# Patient Record
Sex: Male | Born: 1945 | Race: White | Hispanic: No | Smoking: Never smoker
Health system: Southern US, Community
[De-identification: ages and names within clinical notes are randomized; demographics above are authoritative.]

## PROBLEM LIST (undated history)

## (undated) DIAGNOSIS — K219 Gastro-esophageal reflux disease without esophagitis: Secondary | ICD-10-CM

## (undated) DIAGNOSIS — C61 Malignant neoplasm of prostate: Secondary | ICD-10-CM

## (undated) DIAGNOSIS — E785 Hyperlipidemia, unspecified: Secondary | ICD-10-CM

## (undated) DIAGNOSIS — H269 Unspecified cataract: Secondary | ICD-10-CM

## (undated) DIAGNOSIS — M1990 Unspecified osteoarthritis, unspecified site: Secondary | ICD-10-CM

## (undated) DIAGNOSIS — K635 Polyp of colon: Secondary | ICD-10-CM

## (undated) DIAGNOSIS — J342 Deviated nasal septum: Secondary | ICD-10-CM

## (undated) HISTORY — PX: EYE SURGERY: SHX253

## (undated) HISTORY — PX: TONSILLECTOMY: SUR1361

## (undated) HISTORY — DX: Malignant neoplasm of prostate: C61

## (undated) HISTORY — PX: PROSTATECTOMY: SHX69

## (undated) HISTORY — DX: Hyperlipidemia, unspecified: E78.5

## (undated) HISTORY — PX: SHOULDER ARTHROSCOPY: SHX128

## (undated) HISTORY — DX: Polyp of colon: K63.5

## (undated) HISTORY — DX: Unspecified osteoarthritis, unspecified site: M19.90

## (undated) HISTORY — PX: POLYPECTOMY: SHX149

## (undated) HISTORY — DX: Gastro-esophageal reflux disease without esophagitis: K21.9

## (undated) HISTORY — PX: KNEE ARTHROSCOPY: SHX127

## (undated) HISTORY — PX: CATARACT EXTRACTION, BILATERAL: SHX1313

## (undated) HISTORY — PX: COLONOSCOPY: SHX174

## (undated) HISTORY — DX: Deviated nasal septum: J34.2

## (undated) HISTORY — PX: NASAL SEPTUM SURGERY: SHX37

## (undated) HISTORY — DX: Unspecified cataract: H26.9

---

## 2019-11-23 DIAGNOSIS — C61 Malignant neoplasm of prostate: Secondary | ICD-10-CM

## 2019-11-23 HISTORY — DX: Malignant neoplasm of prostate: C61

## 2020-04-14 HISTORY — PX: PROSTATECTOMY: SHX69

## 2020-09-22 ENCOUNTER — Encounter: Payer: Self-pay | Admitting: Gastroenterology

## 2020-10-07 ENCOUNTER — Encounter: Payer: Self-pay | Admitting: Gastroenterology

## 2020-10-07 ENCOUNTER — Ambulatory Visit (INDEPENDENT_AMBULATORY_CARE_PROVIDER_SITE_OTHER): Payer: Medicare Other | Admitting: Gastroenterology

## 2020-10-07 VITALS — BP 124/74 | HR 68 | Ht 71.0 in | Wt 181.5 lb

## 2020-10-07 DIAGNOSIS — Z86010 Personal history of colonic polyps: Secondary | ICD-10-CM | POA: Diagnosis not present

## 2020-10-07 DIAGNOSIS — K219 Gastro-esophageal reflux disease without esophagitis: Secondary | ICD-10-CM | POA: Diagnosis not present

## 2020-10-07 MED ORDER — OMEPRAZOLE 20 MG PO CPDR
20.0000 mg | DELAYED_RELEASE_CAPSULE | Freq: Every day | ORAL | 3 refills | Status: DC
Start: 2020-10-07 — End: 2021-11-09

## 2020-10-07 NOTE — Patient Instructions (Signed)
We have sent the following medications to your pharmacy for you to pick up at your convenience:  Omeprazole  

## 2020-10-07 NOTE — Progress Notes (Signed)
     10/07/2020 Shawn Orozco [id number] [birth year]   HISTORY OF PRESENT ILLNESS:  This is a 74 year old male who is new to our office.  He is here today to establish care and would like to do so with Dr. Havery Moros.  He recently relocated here from [place].  He tells me that he had his last colonoscopy in April 2019 and has history of polyps.  He says he usually gets a colonoscopy every 3 years.  He also reports longstanding acid reflux which is well controlled on omeprazole 20 mg daily and he would like his prescription renewed for 90-day supply.  He tells me that he had an endoscopy, but it was about 15 or 16 years ago.  He says that his medication works well for him.  He denies any breakthrough heartburn or reflux.   Past Medical History:  Diagnosis Date  . Arthritis   . Cataract   . Colon polyps   . Deviated septum   . Prostate cancer [facility])    Past Surgical History:  Procedure Laterality Date  . CATARACT EXTRACTION, BILATERAL    . KNEE ARTHROSCOPY Right   . NASAL SEPTUM SURGERY    . PROSTATECTOMY    . SHOULDER ARTHROSCOPY Right   . TONSILLECTOMY      reports that he has never smoked. He has never used smokeless tobacco. He reports that he does not drink alcohol and does not use drugs. family history includes Alzheimer's disease in his brother and mother; Heart disease in his brother. No Known Allergies    Outpatient Encounter Medications as of 10/07/2020  Medication Sig  . AMBULATORY NON FORMULARY MEDICATION Compound of  Gentamicin 20 mg and Budesonide 60 mg  Used into the nasal passage as sinus rinse  . omeprazole (PRILOSEC) 20 MG capsule Take 20 mg by mouth daily.  . sildenafil (VIAGRA) 100 MG tablet Take 100 mg by mouth daily as needed for erectile dysfunction.   No facility-administered encounter medications on file as of 10/07/2020.     REVIEW OF SYSTEMS  : All other systems reviewed and negative except where noted in the History of Present Illness.   PHYSICAL  EXAM: BP 124/74 (BP Location: Left Arm, Patient Position: Sitting, Cuff Size: Normal)   Pulse 68   Ht 5\' 11"  (1.803 m) Comment: height measured without shoes  Wt 181 lb 8 oz (82.3 kg)   BMI 25.31 kg/m  General: Well developed white male in no acute distress Head: Normocephalic and atraumatic Eyes:  Sclerae anicteric, conjunctiva pink. Ears: Normal auditory acuity Lungs: Clear throughout to auscultation; no W/R/R. Heart: Regular rate and rhythm; no M/R/G. Abdomen: Soft, non-distended.  BS present.  Non-tender. Musculoskeletal: Symmetrical with no gross deformities  Skin: No lesions on visible extremities Extremities: No edema  Neurological: Alert oriented x 4, grossly non-focal Psychological:  Alert and cooperative. Normal mood and affect  ASSESSMENT AND PLAN: *GERD: Well-controlled on omeprazole 20 mg daily.  Needs prescription renewed.  We will send this to pharmacy. *History of colon polyps: Last colonoscopy April 2019.  He says that he usually gets a colonoscopy every 3 years.  Will request records from Dr. Alena Bills from [facility] in [place], [place].  **He would like to establish care here with Dr. Havery Moros.   CC:  No ref. provider found

## 2020-10-13 DIAGNOSIS — Z86010 Personal history of colonic polyps: Secondary | ICD-10-CM | POA: Insufficient documentation

## 2020-10-13 DIAGNOSIS — K219 Gastro-esophageal reflux disease without esophagitis: Secondary | ICD-10-CM | POA: Insufficient documentation

## 2020-10-13 NOTE — Progress Notes (Signed)
Agree with assessment and plan as outlined.  

## 2020-12-05 DIAGNOSIS — J339 Nasal polyp, unspecified: Secondary | ICD-10-CM | POA: Insufficient documentation

## 2020-12-15 ENCOUNTER — Other Ambulatory Visit: Payer: Self-pay

## 2020-12-15 ENCOUNTER — Ambulatory Visit (INDEPENDENT_AMBULATORY_CARE_PROVIDER_SITE_OTHER): Payer: Medicare Other | Admitting: Internal Medicine

## 2020-12-15 ENCOUNTER — Other Ambulatory Visit: Payer: Self-pay | Admitting: Internal Medicine

## 2020-12-15 ENCOUNTER — Encounter: Payer: Self-pay | Admitting: Internal Medicine

## 2020-12-15 VITALS — BP 138/82 | HR 78 | Temp 98.3°F | Resp 16 | Ht 71.5 in | Wt 183.0 lb

## 2020-12-15 DIAGNOSIS — E785 Hyperlipidemia, unspecified: Secondary | ICD-10-CM | POA: Diagnosis not present

## 2020-12-15 DIAGNOSIS — C61 Malignant neoplasm of prostate: Secondary | ICD-10-CM | POA: Diagnosis not present

## 2020-12-15 DIAGNOSIS — Z23 Encounter for immunization: Secondary | ICD-10-CM

## 2020-12-15 DIAGNOSIS — R3981 Functional urinary incontinence: Secondary | ICD-10-CM

## 2020-12-15 DIAGNOSIS — R32 Unspecified urinary incontinence: Secondary | ICD-10-CM | POA: Insufficient documentation

## 2020-12-15 DIAGNOSIS — Z86010 Personal history of colonic polyps: Secondary | ICD-10-CM

## 2020-12-15 DIAGNOSIS — N529 Male erectile dysfunction, unspecified: Secondary | ICD-10-CM | POA: Insufficient documentation

## 2020-12-15 MED ORDER — VITAMIN D3 50 MCG (2000 UT) PO CAPS: 2000.0000 [IU] | ORAL_CAPSULE | Freq: Every day | ORAL | 3 refills | Status: DC

## 2020-12-15 NOTE — Assessment & Plan Note (Addendum)
Prostate cancer Feb 2021 - s/p robotic prostatectomy in [place] F/u Dr Milford Cage in [facility] Start vitamin D daily

## 2020-12-15 NOTE — Assessment & Plan Note (Signed)
Post prostatectomy.  2 pads a day.  Follow-up with urology

## 2020-12-15 NOTE — Patient Instructions (Signed)

## 2020-12-15 NOTE — Assessment & Plan Note (Signed)
Benign.

## 2020-12-15 NOTE — Assessment & Plan Note (Signed)
Just had a stress test ?mild A cardiac CT scan for calcium scoring 11/2020

## 2020-12-15 NOTE — Assessment & Plan Note (Signed)
Last colon 2019, due in 4/22

## 2020-12-15 NOTE — Addendum Note (Signed)
Addended by: Jiles Prows on: 12/15/2020 11:47 AM   Modules accepted: Orders

## 2020-12-15 NOTE — Progress Notes (Signed)
Subjective:  Patient ID: Shawn Orozco, male    DOB: 1946-07-14  Age: 75 y.o. MRN: [id number]  CC: New Patient (Initial Visit)   HPI Shawn Orozco presents for a new pt  Just moved here from [facility]  H/o colon polyps, prostate cancer Feb 2021 - s/p robotic prostatectomy C/o leakage - 2 pads a day  We will have to obtain records from [place]    Outpatient Medications Prior to Visit  Medication Sig Dispense Refill  . AMBULATORY NON FORMULARY MEDICATION Compound of  Gentamicin 20 mg and Budesonide 60 mg  Used into the nasal passage as sinus rinse    . omeprazole (PRILOSEC) 20 MG capsule Take 1 capsule (20 mg total) by mouth daily. 90 capsule 3  . sildenafil (VIAGRA) 100 MG tablet Take 100 mg by mouth daily as needed for erectile dysfunction.     No facility-administered medications prior to visit.    ROS: Review of Systems  Constitutional: Negative for appetite change, fatigue and unexpected weight change.  HENT: Negative for congestion, nosebleeds, sneezing, sore throat and trouble swallowing.   Eyes: Negative for itching and visual disturbance.  Respiratory: Negative for cough.   Cardiovascular: Negative for chest pain, palpitations and leg swelling.  Gastrointestinal: Negative for abdominal distention, blood in stool, diarrhea and nausea.  Genitourinary: Negative for frequency and hematuria.  Musculoskeletal: Negative for back pain, gait problem, joint swelling and neck pain.  Skin: Negative for rash.  Neurological: Negative for dizziness, tremors, speech difficulty and weakness.  Psychiatric/Behavioral: Negative for agitation, dysphoric mood, sleep disturbance and suicidal ideas. The patient is not nervous/anxious.     Objective:  BP 138/82 (BP Location: Left Arm, Patient Position: Sitting, Cuff Size: Small)   Pulse 78   Temp 98.3 F (36.8 C) (Oral)   Resp 16   Ht 5' 11.5" (1.816 m)   Wt 183 lb (83 kg)   SpO2 97%   BMI 25.17 kg/m   BP Readings from Last 3  Encounters:  12/15/20 138/82  10/07/20 124/74    Wt Readings from Last 3 Encounters:  12/15/20 183 lb (83 kg)  10/07/20 181 lb 8 oz (82.3 kg)    Physical Exam Constitutional:      General: He is not in acute distress.    Appearance: He is well-developed.     Comments: NAD  HENT:     Mouth/Throat:     Mouth: Oropharynx is clear and moist.  Eyes:     Conjunctiva/sclera: Conjunctivae normal.     Pupils: Pupils are equal, round, and reactive to light.  Neck:     Thyroid: No thyromegaly.     Vascular: No JVD.  Cardiovascular:     Rate and Rhythm: Normal rate and regular rhythm.     Pulses: Intact distal pulses.     Heart sounds: Normal heart sounds. No murmur heard. No friction rub. No gallop.   Pulmonary:     Effort: Pulmonary effort is normal. No respiratory distress.     Breath sounds: Normal breath sounds. No wheezing or rales.  Chest:     Chest wall: No tenderness.  Abdominal:     General: Bowel sounds are normal. There is no distension.     Palpations: Abdomen is soft. There is no mass.     Tenderness: There is no abdominal tenderness. There is no guarding or rebound.  Musculoskeletal:        General: No tenderness or edema. Normal range of motion.     Cervical back:  Normal range of motion.  Lymphadenopathy:     Cervical: No cervical adenopathy.  Skin:    General: Skin is warm and dry.     Findings: No rash.  Neurological:     Mental Status: He is alert and oriented to person, place, and time.     Cranial Nerves: No cranial nerve deficit.     Motor: No abnormal muscle tone.     Coordination: He displays a negative Romberg sign. Coordination normal.     Gait: Gait normal.     Deep Tendon Reflexes: Reflexes are normal and symmetric.  Psychiatric:        Mood and Affect: Mood and affect normal.        Behavior: Behavior normal.        Thought Content: Thought content normal.        Judgment: Judgment normal.     No results found for: WBC, HGB, HCT, PLT,  GLUCOSE, CHOL, TRIG, HDL, LDLDIRECT, LDLCALC, ALT, AST, NA, K, CL, CREATININE, BUN, CO2, TSH, PSA, INR, GLUF, HGBA1C, MICROALBUR  Patient was never admitted.  Assessment & Plan:   There are no diagnoses linked to this encounter.   No orders of the defined types were placed in this encounter.    Follow-up: No follow-ups on file.  Walker Kehr, MD

## 2020-12-15 NOTE — Assessment & Plan Note (Signed)
On Viagra 

## 2020-12-24 ENCOUNTER — Other Ambulatory Visit: Payer: Self-pay

## 2020-12-24 ENCOUNTER — Ambulatory Visit (INDEPENDENT_AMBULATORY_CARE_PROVIDER_SITE_OTHER)
Admission: RE | Admit: 2020-12-24 | Discharge: 2020-12-24 | Disposition: A | Discharge status: Home / Self Care | Payer: Self-pay | Source: Ambulatory Visit | Attending: Internal Medicine | Admitting: Internal Medicine

## 2020-12-24 DIAGNOSIS — E785 Hyperlipidemia, unspecified: Secondary | ICD-10-CM

## 2020-12-26 ENCOUNTER — Other Ambulatory Visit: Payer: Self-pay | Admitting: Internal Medicine

## 2020-12-26 DIAGNOSIS — I2510 Atherosclerotic heart disease of native coronary artery without angina pectoris: Secondary | ICD-10-CM | POA: Insufficient documentation

## 2020-12-26 DIAGNOSIS — I2583 Coronary atherosclerosis due to lipid rich plaque: Secondary | ICD-10-CM

## 2020-12-26 MED ORDER — ASPIRIN EC 81 MG PO TBEC: 81.0000 mg | DELAYED_RELEASE_TABLET | Freq: Every day | ORAL | 3 refills | Status: AC

## 2020-12-26 MED ORDER — ROSUVASTATIN CALCIUM 5 MG PO TABS: 5.0000 mg | ORAL_TABLET | Freq: Every day | ORAL | 11 refills | Status: DC

## 2021-01-05 ENCOUNTER — Other Ambulatory Visit: Payer: Self-pay

## 2021-01-05 ENCOUNTER — Ambulatory Visit (INDEPENDENT_AMBULATORY_CARE_PROVIDER_SITE_OTHER): Payer: Medicare Other | Admitting: Internal Medicine

## 2021-01-05 ENCOUNTER — Encounter: Payer: Self-pay | Admitting: Internal Medicine

## 2021-01-05 VITALS — BP 120/60 | HR 64 | Ht 71.5 in | Wt 182.0 lb

## 2021-01-05 DIAGNOSIS — I2583 Coronary atherosclerosis due to lipid rich plaque: Secondary | ICD-10-CM | POA: Diagnosis not present

## 2021-01-05 DIAGNOSIS — I2510 Atherosclerotic heart disease of native coronary artery without angina pectoris: Secondary | ICD-10-CM

## 2021-01-05 DIAGNOSIS — I4510 Unspecified right bundle-branch block: Secondary | ICD-10-CM | POA: Diagnosis not present

## 2021-01-05 DIAGNOSIS — E785 Hyperlipidemia, unspecified: Secondary | ICD-10-CM | POA: Diagnosis not present

## 2021-01-05 DIAGNOSIS — C61 Malignant neoplasm of prostate: Secondary | ICD-10-CM | POA: Diagnosis not present

## 2021-01-05 NOTE — Patient Instructions (Signed)
Medication Instructions:  Your physician recommends that you continue on your current medications as directed. Please refer to the Current Medication list given to you today.  *If you need a refill on your cardiac medications before your next appointment, please call your pharmacy*   Lab Work: IN 4 MONTHS: lipid panel and LFT If you have labs (blood work) drawn today and your tests are completely normal, you will receive your results only by: Marland Kitchen MyChart Message (if you have MyChart) OR . A paper copy in the mail If you have any lab test that is abnormal or we need to change your treatment, we will call you to review the results.   Testing/Procedures: NONE   Follow-Up: At [facility], you and your health needs are our priority.  As part of our continuing mission to provide you with exceptional heart care, we have created designated Provider Care Teams.  These Care Teams include your primary Cardiologist (physician) and Advanced Practice Providers (APPs -  Physician Assistants and Nurse Practitioners) who all work together to provide you with the care you need, when you need it.  We recommend signing up for the patient portal called "MyChart".  Sign up information is provided on this After Visit Summary.  MyChart is used to connect with patients for Virtual Visits (Telemedicine).  Patients are able to view lab/test results, encounter notes, upcoming appointments, etc.  Non-urgent messages can be sent to your provider as well.   To learn more about what you can do with MyChart, go to [email or web address].    Your next appointment:   4 month(s)  The format for your next appointment:   In Person  Provider:   You may see Gasper Sells, MD or one of the following Advanced Practice Providers on your designated Care Team:    Melina Copa, PA-C  Ermalinda Barrios, PA-C

## 2021-01-05 NOTE — Progress Notes (Signed)
Cardiology Office Note:    Date:  01/05/2021   ID:  Shawn Orozco, DOB 04/19/1946, MRN [id number]  PCP:  Cassandria Anger, MD   [place]  Cardiologist:  No primary care provider on file.  Advanced Practice Provider:  No care team member to display Electrophysiologist:  None      CC: Discuss CT results Consulted for the evaluation of CAC at the behest of Plotnikov, Evie Lacks, MD  History of Present Illness:    Shawn Orozco is a 75 y.o. male with a hx of HLD, and coronary artery calcification who presents for evaluation 01/05/21.  Oncological History notable for: Malignancies: prostate cancer Surgery: 03/2020 Chemotherapy: None Radiation: none Oncology care spearheaded by: Presently in [place]  Patient notes that he is feeling fine.  Notes that May 24th ofn 2021- had robotic cancer surgery.  Prior to that would walk 4-6 miles a day. Since surgery had recovery (still has bladder control issues), moved from [place], and is hoping to get back to normal.  Patient has recommend by PCP- has CAC.     Has had no chest pain, chest pressure, chest tightness, chest stinging .   Patient exertion notable for remodeling his current house and feels no symptoms.  No shortness of breath, DOE.  No PND or orthopnea.  No bendopnea, weight gain, leg swelling , or abdominal swelling.  No syncope or near syncope . Notes  no palpitations or funny heart beats.     Patient reports prior cardiac testing including  2021 stress test in [place] prior to prostate surgery. Just started Rosuvastatin.  Past Medical History:  Diagnosis Date  . Arthritis   . Cataract   . Colon polyps   . Deviated septum   . Prostate cancer [facility])     Past Surgical History:  Procedure Laterality Date  . CATARACT EXTRACTION, BILATERAL    . KNEE ARTHROSCOPY Right   . NASAL SEPTUM SURGERY    . PROSTATECTOMY    . SHOULDER ARTHROSCOPY Right   . TONSILLECTOMY      Current Medications: Current Meds   Medication Sig  . aspirin EC 81 MG tablet Take 1 tablet (81 mg total) by mouth daily.  . Cholecalciferol (VITAMIN D3) 50 MCG (2000 UT) capsule Take 1 capsule (2,000 Units total) by mouth daily.  . fluorouracil (EFUDEX) 5 % cream APPLY TOPICALLY TWICE DAILY TO THE FOREARMS FOR SIX WEEKS  . Hypertonic Nasal Wash (SINUS RINSE) PACK Place into the nose daily.  Marland Kitchen ketoconazole (NIZORAL) 2 % cream APPLY A SMALL AMOUNT TOPICALLY BETWEEN THE TOES AND TO AFFECTED AREA(S) TWO TIMES A DAY AS NEEDED  . Niacinamide 500 MG TBCR Take 500 mg by mouth daily.  Marland Kitchen omeprazole (PRILOSEC) 20 MG capsule Take 1 capsule (20 mg total) by mouth daily.  . rosuvastatin (CRESTOR) 5 MG tablet Take 1 tablet (5 mg total) by mouth daily.  . sildenafil (VIAGRA) 100 MG tablet Take 100 mg by mouth daily as needed for erectile dysfunction.     Allergies:   Patient has no known allergies.   Social History   Socioeconomic History  . Marital status: Divorced    Spouse name: Not on file  . Number of children: 0  . Years of education: Not on file  . Highest education level: Not on file  Occupational History  . Occupation: retired  Tobacco Use  . Smoking status: Never Smoker  . Smokeless tobacco: Never Used  Vaping Use  . Vaping Use:  Never used  Substance and Sexual Activity  . Alcohol use: Never  . Drug use: Never  . Sexual activity: Not Currently  Other Topics Concern  . Not on file  Social History Narrative  . Not on file   Social Determinants of Health   Financial Resource Strain: Not on file  Food Insecurity: Not on file  Transportation Needs: Not on file  Physical Activity: Inactive  . Days of Exercise per Week: 0 days  . Minutes of Exercise per Session: 0 min  Stress: Not on file  Social Connections: Not on file    SOCIAL:  Moved to be with his brother who passed October 08/2020; Remodeling that house   Family History: The patient's family history includes Alzheimer's disease in his brother and  mother; Heart disease in his brother. History of coronary artery disease notable for two (half) brothers. History of heart failure notable for no members. History of arrhythmia notable for no members. No history of bicuspid aortic valve or aortic aneurysm or dissection.  ROS:   Please see the history of present illness.     All other systems reviewed and are negative.  EKGs/Labs/Other Studies Reviewed:    The following studies were reviewed today:  EKG:   01/05/21: SR rate 64 RBBB  CAC Date: 12/24/20 Results: Coronary calcium score of 118. This was 36th percentile for age and sex matched control   Recent Labs: No results found for requested labs within last 8760 hours.  Recent Lipid Panel No results found for: CHOL, TRIG, HDL, CHOLHDL, VLDL, LDLCALC, LDLDIRECT   Risk Assessment/Calculations:     N/A  Physical Exam:    VS:  BP 120/60   Pulse 64   Ht 5' 11.5" (1.816 m)   Wt 182 lb (82.6 kg)   SpO2 98%   BMI 25.03 kg/m     Wt Readings from Last 3 Encounters:  01/05/21 182 lb (82.6 kg)  12/15/20 183 lb (83 kg)  10/07/20 181 lb 8 oz (82.3 kg)     GEN:  Well nourished, well developed in no acute distress HEENT: Normal NECK: No JVD; No carotid bruits LYMPHATICS: No lymphadenopathy CARDIAC: RRR, no murmurs, rubs, gallops RESPIRATORY:  Clear to auscultation without rales, wheezing or rhonchi  ABDOMEN: Soft, non-tender, non-distended MUSCULOSKELETAL:  No edema; No deformity  SKIN: Warm and dry NEUROLOGIC:  Alert and oriented x 3 PSYCHIATRIC:  Normal affect   ASSESSMENT:    1. 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 Prostate cancer ([place])   3. Dyslipidemia   4. RBBB    PLAN:    In order of problems listed above:  Coronary Artery Calcification -LDL goal less than 70 -continue current statin (recent start) - Reviewed images with patient -Recheck lipid profile LFTs (fasting in 4 months) - gave education on dietary changes  Prostate  Cancer - No chemotherapy or radiation that would increase cardiac risk  Four months follow up unless new symptoms or abnormal test results warranting change in plan  Would be reasonable for  APP Follow up   Medication Adjustments/Labs and Tests Ordered: Current medicines are reviewed at length with the patient today.  Concerns regarding medicines are outlined above.  Orders Placed This Encounter  Procedures  . Lipid panel  . Hepatic function panel  . EKG 12-Lead   No orders of the defined types were placed in this encounter.   Patient Instructions  Medication Instructions:  Your physician recommends that you continue on your current  medications as directed. Please refer to the Current Medication list given to you today.  *If you need a refill on your cardiac medications before your next appointment, please call your pharmacy*   Lab Work: IN 4 MONTHS: lipid panel and LFT If you have labs (blood work) drawn today and your tests are completely normal, you will receive your results only by: Marland Kitchen MyChart Message (if you have MyChart) OR . A paper copy in the mail If you have any lab test that is abnormal or we need to change your treatment, we will call you to review the results.   Testing/Procedures: NONE   Follow-Up: At [facility], you and your health needs are our priority.  As part of our continuing mission to provide you with exceptional heart care, we have created designated Provider Care Teams.  These Care Teams include your primary Cardiologist (physician) and Advanced Practice Providers (APPs -  Physician Assistants and Nurse Practitioners) who all work together to provide you with the care you need, when you need it.  We recommend signing up for the patient portal called "MyChart".  Sign up information is provided on this After Visit Summary.  MyChart is used to connect with patients for Virtual Visits (Telemedicine).  Patients are able to view lab/test results,  encounter notes, upcoming appointments, etc.  Non-urgent messages can be sent to your provider as well.   To learn more about what you can do with MyChart, go to [email or web address].    Your next appointment:   4 month(s)  The format for your next appointment:   In Person  Provider:   You may see Gasper Sells, MD or one of the following Advanced Practice Providers on your designated Care Team:    Melina Copa, PA-C  Ermalinda Barrios, PA-C         Signed, Werner Lean, MD  01/05/2021 8:43 AM    [place]

## 2021-01-14 ENCOUNTER — Other Ambulatory Visit: Payer: Self-pay

## 2021-01-14 ENCOUNTER — Encounter: Payer: Self-pay | Admitting: Gastroenterology

## 2021-01-14 ENCOUNTER — Ambulatory Visit (INDEPENDENT_AMBULATORY_CARE_PROVIDER_SITE_OTHER): Payer: Medicare Other | Admitting: Gastroenterology

## 2021-01-14 VITALS — BP 134/70 | HR 64 | Ht 71.0 in | Wt 192.0 lb

## 2021-01-14 DIAGNOSIS — K219 Gastro-esophageal reflux disease without esophagitis: Secondary | ICD-10-CM | POA: Diagnosis not present

## 2021-01-14 DIAGNOSIS — Z86010 Personal history of colonic polyps: Secondary | ICD-10-CM

## 2021-01-14 NOTE — Progress Notes (Signed)
01/14/2021 Shawn Orozco [id number] [birth year]   HISTORY OF PRESENT ILLNESS: This is a pleasant 75 year old male who saw me to establish care in November 2021.  He had moved here from [place].  He has history of colon polyps and GERD.  He was wanting a renewal of his prescription omeprazole 20 mg daily.  He comes here today again for follow-up.  He tells me that his last colonoscopy was in April 2019 and that he was told that he needed another one in 3 years.  Also, he reports that since he moved here several months ago he has had 2 or 3 episodes of sudden onset diarrhea followed by episodes of vomiting.  He had 1 of these episodes about 3 weeks ago.  It lasted for 3 days and then resolved with no residual symptoms.  He is currently feeling well.  His bowel habits are back to normal.  He says that occasionally he will feel reflux and discomfort in his epigastrium.  He is only using his omeprazole on an as-needed basis.  His weight is stable, actually up about 11 pounds from his visit here in November.    Past Medical History:  Diagnosis Date  . Arthritis   . Cataract   . Colon polyps   . Deviated septum   . Prostate cancer [facility])    Past Surgical History:  Procedure Laterality Date  . CATARACT EXTRACTION, BILATERAL    . KNEE ARTHROSCOPY Right   . NASAL SEPTUM SURGERY    . PROSTATECTOMY    . SHOULDER ARTHROSCOPY Right   . TONSILLECTOMY      reports that he has never smoked. He has never used smokeless tobacco. He reports that he does not drink alcohol and does not use drugs. family history includes Alzheimer's disease in his brother and mother; Heart disease in his brother. No Known Allergies    Outpatient Encounter Medications as of 01/14/2021  Medication Sig  . aspirin EC 81 MG tablet Take 1 tablet (81 mg total) by mouth daily.  . Cholecalciferol (VITAMIN D3) 50 MCG (2000 UT) capsule Take 1 capsule (2,000 Units total) by mouth daily.  . fluorouracil (EFUDEX) 5 % cream APPLY  TOPICALLY TWICE DAILY TO THE FOREARMS FOR SIX WEEKS  . Hypertonic Nasal Wash (SINUS RINSE) PACK Place into the nose daily.  Marland Kitchen ketoconazole (NIZORAL) 2 % cream APPLY A SMALL AMOUNT TOPICALLY BETWEEN THE TOES AND TO AFFECTED AREA(S) TWO TIMES A DAY AS NEEDED  . Niacinamide 500 MG TBCR Take 500 mg by mouth daily.  Marland Kitchen omeprazole (PRILOSEC) 20 MG capsule Take 1 capsule (20 mg total) by mouth daily.  . rosuvastatin (CRESTOR) 5 MG tablet Take 1 tablet (5 mg total) by mouth daily.  . sildenafil (VIAGRA) 100 MG tablet Take 100 mg by mouth daily as needed for erectile dysfunction.   No facility-administered encounter medications on file as of 01/14/2021.     REVIEW OF SYSTEMS  : All other systems reviewed and negative except where noted in the History of Present Illness.   PHYSICAL EXAM: BP 134/70 (BP Location: Left Arm, Patient Position: Sitting, Cuff Size: Normal)   Pulse 64   Ht 5\' 11"  (1.803 m)   Wt 192 lb (87.1 kg)   BMI 26.78 kg/m  General: Well developed white male in no acute distress Head: Normocephalic and atraumatic Eyes:  Sclerae anicteric, conjunctiva pink. Ears: Normal auditory acuity Lungs: Clear throughout to auscultation; no W/R/R. Heart: Regular rate and rhythm; no M/R/G. Abdomen:  Soft, non-distended.  BS present.  Non-tender. Musculoskeletal: Symmetrical with no gross deformities  Skin: No lesions on visible extremities Extremities: No edema  Neurological: Alert oriented x 4, grossly non-focal Psychological:  Alert and cooperative. Normal mood and affect  ASSESSMENT AND PLAN: *GERD: Has only been using his omeprazole 20 mg as needed.  Sounds like he may be having some flare of symptoms more frequently recently.  I have asked him to begin taking his medication daily.  May want to consider EGD since his last was several years ago, but I am waiting to see when he needs a recall colonoscopy so that way we could have both performed if colonoscopy is needed at this time.  Will  contact him soon with suggestion.   **We actually then received his colonoscopy records today before I completed my note.  He had colonoscopy April 2019 at which time he had one adenomatous polyp removed from the cecum as well as diverticuli and hemorrhoids.  Colonoscopy from February 19, 2015 showed multiple polyps that were removed up to 8 mm in size, some of those being adenomatous polyps and some being hyperplastic polyps.  There were 7 removed in total.  He also had diverticulosis and hemorrhoids.  In March 2013 he had an EGD that showed chronic reflux esophagitis and gastritis.  He also had a colonoscopy that showed 5 polyps that were removed, a couple that were adenomatous polyps including 1 of those being a serrated adenoma of the right colon and others hyperplastic polyps.  Gastric biopsy showed no evidence of H. pylori.  Distal esophageal biopsy showed mild acute and chronic esophagitis with no Barrett's esophagus or eosinophilic esophagitis.  I am going to have Dr. Havery Moros review these records to give recommendations on recall and they will set be sent to be scanned.  CC:  Plotnikov, Evie Lacks, MD

## 2021-01-14 NOTE — Patient Instructions (Addendum)
Continue Omeprazole 20mg  once daily- everyday.   We will obtain your records from Dr. Biagio Borg office. If you have not heard from our office in 1 week, please call and ask for Azan Maneri - [phone].   If you are age 76 or older, your body mass index should be between 23-30. Your Body mass index is 26.78 kg/m. If this is out of the aforementioned range listed, please consider follow up with your Primary Care Provider.  If you are age 78 or younger, your body mass index should be between 19-25. Your Body mass index is 26.78 kg/m. If this is out of the aformentioned range listed, please consider follow up with your Primary Care Provider.    Thank you for choosing me and [facility].  Jessica Zehr-PA C

## 2021-01-15 NOTE — Progress Notes (Signed)
Agree with assessment with the following thoughts: Looks like his last colonoscopy in April 2019 he had one adenoma removed. Keane Scrape can you clarify what the size was of this? If small, < 1cm, he would not be due for another colonoscopy until 02/2023 given his history of 7 polyps removed on his exam prior to that. If however the polyp was large, 1cm or greater, he would be do for another colonoscopy in April of this year.   Otherwise, he had esophagitis on EGD in 2013. Given his reflux symptoms and upper tract symptoms, I think a follow up EGD after he has been on omeprazole 20mg  / day for one month is reasonable to rule out Barrett's if he is interested in pursuing that. Thanks

## 2021-01-15 NOTE — Progress Notes (Signed)
I reviewed prior colonoscopy note from 2019.  The reports is a hand written note which I can't interpret other than he had a cecal polyp removed, no mention of size of the polyp or prep quality. While he may not truly need an exam now if he only had one small polyp, his other GI provider recommended a 3 year follow up based on this result, so I would add the colonoscopy to be done at the same time as his EGD, now, as I can't otherwise read much of any of the report of his last exam due to poor hand writing. There is no typed report.

## 2021-01-21 NOTE — Progress Notes (Signed)
Spoke with patient and scheduled his endo/colon and his previsit appointment for April.

## 2021-02-25 ENCOUNTER — Other Ambulatory Visit: Payer: Self-pay

## 2021-02-26 ENCOUNTER — Encounter: Payer: Self-pay | Admitting: Internal Medicine

## 2021-02-26 ENCOUNTER — Other Ambulatory Visit: Payer: Self-pay

## 2021-02-26 ENCOUNTER — Ambulatory Visit (INDEPENDENT_AMBULATORY_CARE_PROVIDER_SITE_OTHER): Payer: Medicare Other | Admitting: Internal Medicine

## 2021-02-26 ENCOUNTER — Ambulatory Visit (AMBULATORY_SURGERY_CENTER): Payer: Self-pay | Admitting: *Deleted

## 2021-02-26 VITALS — Ht 71.0 in | Wt 188.0 lb

## 2021-02-26 DIAGNOSIS — E785 Hyperlipidemia, unspecified: Secondary | ICD-10-CM | POA: Diagnosis not present

## 2021-02-26 DIAGNOSIS — Z86010 Personal history of colonic polyps: Secondary | ICD-10-CM

## 2021-02-26 DIAGNOSIS — R3981 Functional urinary incontinence: Secondary | ICD-10-CM | POA: Diagnosis not present

## 2021-02-26 DIAGNOSIS — I2583 Coronary atherosclerosis due to lipid rich plaque: Secondary | ICD-10-CM | POA: Diagnosis not present

## 2021-02-26 DIAGNOSIS — C61 Malignant neoplasm of prostate: Secondary | ICD-10-CM | POA: Diagnosis not present

## 2021-02-26 DIAGNOSIS — I2510 Atherosclerotic heart disease of native coronary artery without angina pectoris: Secondary | ICD-10-CM | POA: Diagnosis not present

## 2021-02-26 DIAGNOSIS — K219 Gastro-esophageal reflux disease without esophagitis: Secondary | ICD-10-CM

## 2021-02-26 LAB — COMPREHENSIVE METABOLIC PANEL
ALT: 13 U/L (ref 0–53)
AST: 18 U/L (ref 0–37)
Albumin: 4.3 g/dL (ref 3.5–5.2)
Alkaline Phosphatase: 80 U/L (ref 39–117)
BUN: 16 mg/dL (ref 6–23)
CO2: 30 mEq/L (ref 19–32)
Calcium: 9.7 mg/dL (ref 8.4–10.5)
Chloride: 104 mEq/L (ref 96–112)
Creatinine, Ser: 1.19 mg/dL (ref 0.40–1.50)
GFR: 59.84 mL/min — ABNORMAL LOW (ref >60.00)
Glucose, Bld: 104 mg/dL — ABNORMAL HIGH (ref 70–99)
Potassium: 4 mEq/L (ref 3.5–5.1)
Sodium: 140 mEq/L (ref 135–145)
Total Bilirubin: 1.9 mg/dL — ABNORMAL HIGH (ref 0.2–1.2)
Total Protein: 7.1 g/dL (ref 6.0–8.3)

## 2021-02-26 LAB — LIPID PANEL
Cholesterol: 154 mg/dL (ref 0–200)
HDL: 46.4 mg/dL (ref >39.00)
LDL Cholesterol: 85 mg/dL (ref 0–99)
NonHDL: 108.03
Total CHOL/HDL Ratio: 3
Triglycerides: 114 mg/dL (ref 0.0–149.0)
VLDL: 22.8 mg/dL (ref 0.0–40.0)

## 2021-02-26 MED ORDER — SUTAB 1479-225-188 MG PO TABS: 24.0000 | ORAL_TABLET | ORAL | 0 refills | Status: DC

## 2021-02-26 NOTE — Progress Notes (Signed)
No egg or soy allergy known to patient  No issues with past sedation with any surgeries or procedures Patient denies ever being told they had issues or difficulty with intubation  No FH of Malignant Hyperthermia No diet pills per patient No home 02 use per patient  No blood thinners per patient  Pt denies issues with constipation  No A fib or A flutter  EMMI video to pt or via [place] 19 guidelines implemented in [place] today with Pt and RN  Pt is fully vaccinated  for [employer] given to pt in [place] today , Code to Pharmacy and  NO PA's for preps discussed with pt In PV today  Discussed with pt there will be an out-of-pocket cost for prep and that varies from $0 to 70 dollars   Due to the COVID-19 pandemic we are asking patients to follow certain guidelines.  Pt aware of COVID protocols and [facility] guidelines   Pt denies bonded. Loose or missing teeth- he has a front crown - no dentures

## 2021-02-26 NOTE — Assessment & Plan Note (Addendum)
F/u w/Dr Gasper Sells On aspirin and Crestor Lipids

## 2021-02-26 NOTE — Assessment & Plan Note (Signed)
Lipids ordered On Crestor Goal LDL <70 Dr Gasper Sells

## 2021-02-26 NOTE — Addendum Note (Signed)
Addended by: Earnstine Regal on: 02/26/2021 10:13 AM   Modules accepted: Orders

## 2021-02-26 NOTE — Assessment & Plan Note (Signed)
F/u Dr Milford Cage  PSA on Mon

## 2021-02-26 NOTE — Assessment & Plan Note (Signed)
Better  

## 2021-02-26 NOTE — Addendum Note (Signed)
Addended by: Boris Lown B on: 02/26/2021 10:14 AM   Modules accepted: Orders

## 2021-02-26 NOTE — Progress Notes (Signed)
Subjective:  Patient ID: Shawn Orozco, male    DOB: 1946/07/31  Age: 75 y.o. MRN: [id number]  CC: Follow-up (3 month f/u)   HPI Shawn Orozco presents for CAD, dyslipidemia, prostate cancer  Outpatient Medications Prior to Visit  Medication Sig Dispense Refill  . aspirin EC 81 MG tablet Take 1 tablet (81 mg total) by mouth daily. 100 tablet 3  . Cholecalciferol (VITAMIN D3) 50 MCG (2000 UT) capsule Take 1 capsule (2,000 Units total) by mouth daily. 100 capsule 3  . Hypertonic Nasal Wash (SINUS RINSE) PACK Place into the nose daily.    Marland Kitchen ketoconazole (NIZORAL) 2 % cream APPLY A SMALL AMOUNT TOPICALLY BETWEEN THE TOES AND TO AFFECTED AREA(S) TWO TIMES A DAY AS NEEDED    . Niacinamide 500 MG TBCR Take 500 mg by mouth daily.    Marland Kitchen omeprazole (PRILOSEC) 20 MG capsule Take 1 capsule (20 mg total) by mouth daily. 90 capsule 3  . rosuvastatin (CRESTOR) 5 MG tablet Take 1 tablet (5 mg total) by mouth daily. 30 tablet 11  . sildenafil (VIAGRA) 100 MG tablet Take 100 mg by mouth daily as needed for erectile dysfunction.    . fluorouracil (EFUDEX) 5 % cream APPLY TOPICALLY TWICE DAILY TO THE FOREARMS FOR SIX WEEKS     No facility-administered medications prior to visit.    ROS: Review of Systems  Constitutional: Negative for appetite change, fatigue and unexpected weight change.  HENT: Negative for congestion, nosebleeds, sneezing, sore throat and trouble swallowing.   Eyes: Negative for itching and visual disturbance.  Respiratory: Negative for cough.   Cardiovascular: Negative for chest pain, palpitations and leg swelling.  Gastrointestinal: Negative for abdominal distention, blood in stool, diarrhea and nausea.  Genitourinary: Negative for frequency and hematuria.  Musculoskeletal: Negative for back pain, gait problem, joint swelling and neck pain.  Skin: Negative for rash.  Neurological: Negative for dizziness, tremors, speech difficulty and weakness.  Psychiatric/Behavioral: Negative for  agitation, dysphoric mood and sleep disturbance. The patient is not nervous/anxious.     Objective:  BP 120/72 (BP Location: Left Arm)   Pulse 68   Temp 98.4 F (36.9 C) (Oral)   Ht 5\' 11"  (1.803 m)   Wt 188 lb 3.2 oz (85.4 kg)   SpO2 95%   BMI 26.25 kg/m   BP Readings from Last 3 Encounters:  02/26/21 120/72  01/14/21 134/70  01/05/21 120/60    Wt Readings from Last 3 Encounters:  02/26/21 188 lb 3.2 oz (85.4 kg)  01/14/21 192 lb (87.1 kg)  01/05/21 182 lb (82.6 kg)    Physical Exam  No results found for: WBC, HGB, HCT, PLT, GLUCOSE, CHOL, TRIG, HDL, LDLDIRECT, LDLCALC, ALT, AST, NA, K, CL, CREATININE, BUN, CO2, TSH, PSA, INR, GLUF, HGBA1C, MICROALBUR  CT CARDIAC SCORING (SELF PAY ONLY)  Addendum Date: 12/24/2020   ADDENDUM REPORT: 12/24/2020 12:17 CLINICAL DATA:  Risk stratification, CAD screening, 20yr CHD risk < 10%, Dyslipidemia EXAM: Coronary Calcium Score TECHNIQUE: The patient was scanned on a [employer] scanner. Axial non-contrast 3 mm slices were carried out through the heart. The data set was analyzed on a dedicated work station and scored using the Agatston method. FINDINGS: Non-cardiac: See separate report from [facility]. Motion artifact is present. Ascending Aorta: 36 mm at the mid ascending aorta measured in an axial plane. Pericardium: Normal Coronary arteries: Coronary calcium score of 118. This was 36th percentile for age and sex matched control. Calcium seen in the proximal and mid LAD  proximal L circumflex. IMPRESSION: Coronary calcium score of 118. This was 36th percentile for age and sex matched control. Electronically Signed   By: Cherlynn Kaiser   On: 12/24/2020 12:17   Result Date: 12/24/2020 EXAM: OVER-READ INTERPRETATION  CT CHEST The following report is an over-read performed by radiologist Dr. Vinnie Langton of [facility], [place] on 12/24/2020. This over-read does not include interpretation of cardiac or coronary anatomy or pathology.  The coronary calcium score interpretation by the cardiologist is attached. COMPARISON:  None. FINDINGS: Within the visualized portions of the thorax there are no suspicious appearing pulmonary nodules or masses, there is no acute consolidative airspace disease, no pleural effusions, no pneumothorax and no lymphadenopathy. Visualized portions of the upper abdomen are unremarkable. There are no aggressive appearing lytic or blastic lesions noted in the visualized portions of the skeleton. IMPRESSION: 1. No significant incidental noncardiac findings are noted. Electronically Signed: By: Vinnie Langton M.D. On: 12/24/2020 08:51    Assessment & Plan:

## 2021-03-12 ENCOUNTER — Other Ambulatory Visit: Payer: Self-pay

## 2021-03-12 ENCOUNTER — Encounter: Payer: Self-pay | Admitting: Gastroenterology

## 2021-03-12 ENCOUNTER — Ambulatory Visit (AMBULATORY_SURGERY_CENTER): Payer: Medicare Other | Admitting: Gastroenterology

## 2021-03-12 VITALS — BP 117/70 | HR 51 | Temp 98.9°F | Resp 8 | Ht 71.0 in | Wt 188.0 lb

## 2021-03-12 DIAGNOSIS — K219 Gastro-esophageal reflux disease without esophagitis: Secondary | ICD-10-CM

## 2021-03-12 DIAGNOSIS — K449 Diaphragmatic hernia without obstruction or gangrene: Secondary | ICD-10-CM | POA: Diagnosis not present

## 2021-03-12 DIAGNOSIS — Z86010 Personal history of colonic polyps: Secondary | ICD-10-CM

## 2021-03-12 DIAGNOSIS — K2970 Gastritis, unspecified, without bleeding: Secondary | ICD-10-CM

## 2021-03-12 DIAGNOSIS — K2950 Unspecified chronic gastritis without bleeding: Secondary | ICD-10-CM | POA: Diagnosis not present

## 2021-03-12 DIAGNOSIS — K621 Rectal polyp: Secondary | ICD-10-CM | POA: Diagnosis not present

## 2021-03-12 DIAGNOSIS — D122 Benign neoplasm of ascending colon: Secondary | ICD-10-CM

## 2021-03-12 DIAGNOSIS — K317 Polyp of stomach and duodenum: Secondary | ICD-10-CM | POA: Diagnosis not present

## 2021-03-12 DIAGNOSIS — D128 Benign neoplasm of rectum: Secondary | ICD-10-CM

## 2021-03-12 MED ORDER — SODIUM CHLORIDE 0.9 % IV SOLN: 500.0000 mL | Freq: Once | INTRAVENOUS | Status: DC

## 2021-03-12 NOTE — Progress Notes (Signed)
Called to room to assist during endoscopic procedure.  Patient ID and intended procedure confirmed with present staff. Received instructions for my participation in the procedure from the performing physician.  

## 2021-03-12 NOTE — Patient Instructions (Signed)
Handouts given:  Gastritis, Diverticulosis, Hemorrhoids, polyps Resume previous diet Continue current medications Increase omeprazole 40mg  to twice daily Await pathology results Minimize NSAID's  YOU HAD AN ENDOSCOPIC PROCEDURE TODAY AT [place]:   Refer to the procedure report that was given to you for any specific questions about what was found during the examination.  If the procedure report does not answer your questions, please call your gastroenterologist to clarify.  If you requested that your care partner not be given the details of your procedure findings, then the procedure report has been included in a sealed envelope for you to review at your convenience later.  YOU SHOULD EXPECT: Some feelings of bloating in the abdomen. Passage of more gas than usual.  Walking can help get rid of the air that was put into your GI tract during the procedure and reduce the bloating. If you had a lower endoscopy (such as a colonoscopy or flexible sigmoidoscopy) you may notice spotting of blood in your stool or on the toilet paper. If you underwent a bowel prep for your procedure, you may not have a normal bowel movement for a few days.  Please Note:  You might notice some irritation and congestion in your nose or some drainage.  This is from the oxygen used during your procedure.  There is no need for concern and it should clear up in a day or so.  SYMPTOMS TO REPORT IMMEDIATELY:   Following lower endoscopy (colonoscopy or flexible sigmoidoscopy):  Excessive amounts of blood in the stool  Significant tenderness or worsening of abdominal pains  Swelling of the abdomen that is new, acute  Fever of 100F or higher   Following upper endoscopy (EGD)  Vomiting of blood or coffee ground material  New chest pain or pain under the shoulder blades  Painful or persistently difficult swallowing  New shortness of breath  Fever of 100F or higher  Black, tarry-looking stools  For  urgent or emergent issues, a gastroenterologist can be reached at any hour by calling [phone]. Do not use MyChart messaging for urgent concerns.   DIET:  We do recommend a small meal at first, but then you may proceed to your regular diet.  Drink plenty of fluids but you should avoid alcoholic beverages for 24 hours.  ACTIVITY:  You should plan to take it easy for the rest of today and you should NOT DRIVE or use heavy machinery until tomorrow (because of the sedation medicines used during the test).    FOLLOW UP: Our staff will call the number listed on your records 48-72 hours following your procedure to check on you and address any questions or concerns that you may have regarding the information given to you following your procedure. If we do not reach you, we will leave a message.  We will attempt to reach you two times.  During this call, we will ask if you have developed any symptoms of COVID 19. If you develop any symptoms (ie: fever, flu-like symptoms, shortness of breath, cough etc.) before then, please call [phone].  If you test positive for Covid 19 in the 2 weeks post procedure, please call and report this information to [place].    If any biopsies were taken you will be contacted by phone or by letter within the next 1-3 weeks.  Please call us at [phone] if you have not heard about the biopsies in 3 weeks.   SIGNATURES/CONFIDENTIALITY: You and/or your care partner have signed  paperwork which will be entered into your electronic medical record.  These signatures attest to the fact that that the information above on your After Visit Summary has been reviewed and is understood.  Full responsibility of the confidentiality of this discharge information lies with you and/or your care-partner.

## 2021-03-12 NOTE — Op Note (Signed)
[place] Patient Name: Shawn Orozco Procedure Date: 03/12/2021 2:50 PM MRN: [id number] Endoscopist: Remo Lipps P. Havery Moros , MD Age: 75 Referring MD:  Date of Birth: Jun 21, 1946 Gender: Male Account #: [id number] Procedure:                Colonoscopy Indications:              High risk colon cancer surveillance: Personal                            history of colonic polyps (2019, 2016 - 7, 2013) Medicines:                Monitored Anesthesia Care Procedure:                Pre-Anesthesia Assessment:                           - Prior to the procedure, a History and Physical                            was performed, and patient medications and                            allergies were reviewed. The patient's tolerance of                            previous anesthesia was also reviewed. The risks                            and benefits of the procedure and the sedation                            options and risks were discussed with the patient.                            All questions were answered, and informed consent                            was obtained. Prior Anticoagulants: The patient has                            taken no previous anticoagulant or antiplatelet                            agents. ASA Grade Assessment: II - A patient with                            mild systemic disease. After reviewing the risks                            and benefits, the patient was deemed in                            satisfactory condition to undergo the procedure.  After obtaining informed consent, the colonoscope                            was passed under direct vision. Throughout the                            procedure, the patient's blood pressure, pulse, and                            oxygen saturations were monitored continuously. The                            Olympus CF-HQ190L [id number]) Colonoscope was                            introduced  through the anus and advanced to the the                            cecum, identified by appendiceal orifice and                            ileocecal valve. The colonoscopy was performed                            without difficulty. The patient tolerated the                            procedure well. The quality of the bowel                            preparation was good. The ileocecal valve,                            appendiceal orifice, and rectum were photographed. Scope In: 3:18:45 PM Scope Out: 3:38:12 PM Scope Withdrawal Time: 0 hours 15 minutes 13 seconds  Total Procedure Duration: 0 hours 19 minutes 27 seconds  Findings:                 The perianal and digital rectal examinations were                            normal.                           A 4 mm polyp was found in the ascending colon. The                            polyp was sessile. The polyp was removed with a                            cold snare. Resection and retrieval were complete.                           A 3 mm polyp was found in the rectum. The polyp was  sessile. The polyp was removed with a cold snare.                            Resection and retrieval were complete.                           Multiple medium-mouthed diverticula were found in                            the sigmoid colon.                           Internal hemorrhoids were found during                            retroflexion. The hemorrhoids were moderate.                           The exam was otherwise without abnormality. Complications:            No immediate complications. Estimated blood loss:                            Minimal. Estimated Blood Loss:     Estimated blood loss was minimal. Impression:               - One 4 mm polyp in the ascending colon, removed                            with a cold snare. Resected and retrieved.                           - One 3 mm polyp in the rectum, removed with a cold                             snare. Resected and retrieved.                           - Diverticulosis in the sigmoid colon.                           - Internal hemorrhoids.                           - The examination was otherwise normal. Recommendation:           - Patient has a contact number available for                            emergencies. The signs and symptoms of potential                            delayed complications were discussed with the                            patient. Return to normal activities tomorrow.  Written discharge instructions were provided to the                            patient.                           - Resume previous diet.                           - Continue present medications.                           - Await pathology results. Remo Lipps P. Duaine Radin, MD 03/12/2021 3:42:20 PM This report has been signed electronically.

## 2021-03-12 NOTE — Progress Notes (Signed)
Vs by CW Pt's states no medical or surgical changes since previsit or office visit.  

## 2021-03-12 NOTE — Progress Notes (Signed)
A/ox3, pleased with MAC, report to RN 

## 2021-03-12 NOTE — Op Note (Signed)
[place] Patient Name: Shawn Orozco Procedure Date: 03/12/2021 2:56 PM MRN: [id number] Endoscopist: Remo Lipps P. Havery Moros , MD Age: 75 Referring MD:  Date of Birth: Sep 01, 1946 Gender: Male Account #: [id number] Procedure:                Upper GI endoscopy Indications:              Screening for Barrett's esophagus, Follow-up of                            gastro-esophageal reflux disease with history of                            esophagitis, now on omeprazole 20mg  / day with                            control of symptoms Medicines:                Monitored Anesthesia Care Procedure:                Pre-Anesthesia Assessment:                           - Prior to the procedure, a History and Physical                            was performed, and patient medications and                            allergies were reviewed. The patient's tolerance of                            previous anesthesia was also reviewed. The risks                            and benefits of the procedure and the sedation                            options and risks were discussed with the patient.                            All questions were answered, and informed consent                            was obtained. Prior Anticoagulants: The patient has                            taken no previous anticoagulant or antiplatelet                            agents. ASA Grade Assessment: II - A patient with                            mild systemic disease. After reviewing the risks  and benefits, the patient was deemed in                            satisfactory condition to undergo the procedure.                           After obtaining informed consent, the endoscope was                            passed under direct vision. Throughout the                            procedure, the patient's blood pressure, pulse, and                            oxygen saturations were monitored  continuously. The                            Endoscope was introduced through the mouth, and                            advanced to the second part of duodenum. The upper                            GI endoscopy was accomplished without difficulty.                            The patient tolerated the procedure well. Scope In: Scope Out: Findings:                 Esophagogastric landmarks were identified: the                            Z-line was found at 39 cm, the gastroesophageal                            junction was found at 39 cm and the upper extent of                            the gastric folds was found at 40 cm from the                            incisors.                           A 1 cm hiatal hernia was present.                           The exam of the esophagus was otherwise normal.                           Multiple small sessile polyps were found in the  gastric body, grossly consistent with benign fundic                            gland polyps. Biopsies of a few representative                            polyps were taken with a cold forceps for histology.                           Patchy moderate inflammation characterized by                            erosions, erythema and friability was found in the                            gastric antrum.                           The exam of the stomach was otherwise normal.                           Biopsies were taken with a cold forceps in the                            gastric body, at the incisura and in the gastric                            antrum for Helicobacter pylori testing.                           Patchy mildly erythematous mucosa was found in the                            second portion of the duodenum. Biopsies for                            histology were taken with a cold forceps for                            evaluation.                           The exam of the duodenum was  otherwise normal. Complications:            No immediate complications. Estimated blood loss:                            Minimal. Estimated Blood Loss:     Estimated blood loss was minimal. Impression:               - Esophagogastric landmarks identified.                           - 1 cm hiatal hernia.                           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 Biopsied.                           - Gastritis of the antrum. Normal stomach otherwise                            - biopsies taken to rule out H pylori                           - Erythematous duodenopathy. Biopsied. Recommendation:           - Patient has a contact number available for                            emergencies. The signs and symptoms of potential                            delayed complications were discussed with the                            patient. Return to normal activities tomorrow.                            Written discharge instructions were provided to the                            patient.                           - Resume previous diet.                           - Continue present medications. Increase omeprazole                            to twice daily for the next 4 weeks to treat                            gastritis                           - Await pathology results.                           - Minimize NSAID use [name], MD 03/12/2021 3:47:56 PM This report has been signed electronically.

## 2021-03-16 ENCOUNTER — Telehealth: Payer: Self-pay | Admitting: *Deleted

## 2021-03-16 NOTE — Telephone Encounter (Signed)
  Follow up Call-  Call back number 03/12/2021  Post procedure Call Back phone  # [phone]  Permission to leave phone message Yes     Patient questions:  Do you have a fever, pain , or abdominal swelling? No. Pain Score  0 *  Have you tolerated food without any problems? Yes.    Have you been able to return to your normal activities? Yes.    Do you have any questions about your discharge instructions: Diet   No. Medications  No. Follow up visit  No.  Do you have questions or concerns about your Care? Yes.    Actions: * If pain score is 4 or above: No action needed, pain <4.  1. Have you developed a fever since your procedure? no  2.   Have you had an respiratory symptoms (SOB or cough) since your procedure? no  3.   Have you tested positive for COVID 19 since your procedure no  4.   Have you had any family members/close contacts diagnosed with the COVID 19 since your procedure? no   If yes to any of these questions please route to Joylene John, RN and Joella Prince, RN

## 2021-03-30 ENCOUNTER — Telehealth: Payer: Self-pay

## 2021-03-30 NOTE — Telephone Encounter (Signed)
Patient prefers to call back for AWV when he has cleared all other upcoming appointments.

## 2021-05-06 NOTE — Progress Notes (Signed)
Cardiology Office Note:    Date:  05/07/2021   ID:  Shawn Orozco, DOB 22-Aug-1946, MRN [id number]  PCP:  Cassandria Anger, MD   [place]  Cardiologist:  Werner Lean, MD  Advanced Practice Provider:  No care team member to display Electrophysiologist:  None      CC: CAC follow up  History of Present Illness:    Shawn Orozco is a 75 y.o. male with a hx of HLD, and coronary artery calcification who presents for evaluation 01/05/21. In interim of this visit, patient had interval lipids.  Seen 05/07/21.  Oncological History notable for: Malignancies: prostate cancer Surgery: 03/2020 Chemotherapy: None Radiation: none Oncology care spearheaded by: Presently in [place]  Patient notes that he is doing well.  Since last visit notes  starting a walking program and has been remodeling the outside of his house.  Noted one episodes of chest pain with cough back four weeks ago.  No chest discomfort with activity.  Chest pain worsened with cough improved on its own.  No radiation.  No SOB/DOE and no PND/Orthopnea.  Notes ~ 4 lbs weight gain but no leg swelling.  No palpitations or syncope.   Past Medical History:  Diagnosis Date   Arthritis    Cataract    removed bilat 2014 with lens implants    Colon polyps    Deviated septum    GERD (gastroesophageal reflux disease)    Hyperlipidemia    preventative for FH    Prostate cancer ([place]) 2021   04-14-2020 prostate surgery     Past Surgical History:  Procedure Laterality Date   CATARACT EXTRACTION, BILATERAL     COLONOSCOPY     KNEE ARTHROSCOPY Right    NASAL SEPTUM SURGERY     POLYPECTOMY     PROSTATECTOMY  04/14/2020   SHOULDER ARTHROSCOPY Right    TONSILLECTOMY      Current Medications: Current Meds  Medication Sig   aspirin EC 81 MG tablet Take 1 tablet (81 mg total) by mouth daily.   Cholecalciferol (VITAMIN D3) 50 MCG (2000 UT) capsule Take 1 capsule (2,000 Units total) by mouth daily.    Hypertonic Nasal Wash (SINUS RINSE) PACK Place into the nose daily.   ketoconazole (NIZORAL) 2 % cream APPLY A SMALL AMOUNT TOPICALLY BETWEEN THE TOES AND TO AFFECTED AREA(S) TWO TIMES A DAY AS NEEDED   Niacinamide 500 MG TBCR Take 500 mg by mouth daily.   omeprazole (PRILOSEC) 20 MG capsule Take 1 capsule (20 mg total) by mouth daily.   rosuvastatin (CRESTOR) 5 MG tablet Take 1 tablet (5 mg total) by mouth daily.   sildenafil (VIAGRA) 100 MG tablet Take 100 mg by mouth daily as needed for erectile dysfunction.     Allergies:   Patient has no known allergies.   Social History   Socioeconomic History   Marital status: Divorced    Spouse name: Not on file   Number of children: 0   Years of education: Not on file   Highest education level: Not on file  Occupational History   Occupation: retired  Tobacco Use   Smoking status: Never   Smokeless tobacco: Never  Vaping Use   Vaping Use: Never used  Substance and Sexual Activity   Alcohol use: Never   Drug use: Never   Sexual activity: Not Currently  Other Topics Concern   Not on file  Social History Narrative   Not on file   Social Determinants of  Health   Financial Resource Strain: Not on file  Food Insecurity: Not on file  Transportation Needs: Not on file  Physical Activity: Inactive   Days of Exercise per Week: 0 days   Minutes of Exercise per Session: 0 min  Stress: Not on file  Social Connections: Not on file    SOCIAL:  Moved to be with his brother who passed October 08/2020; Remodeled that house during our time together and is no working on the yard  Family History: The patient's family history includes Alzheimer's disease in his brother and mother; Heart disease in his brother and brother. There is no history of Colon cancer, Colon polyps, Esophageal cancer, Rectal cancer, or Stomach cancer. History of coronary artery disease notable for two (half) brothers. History of heart failure notable for no  members. History of arrhythmia notable for no members. No history of bicuspid aortic valve or aortic aneurysm or dissection.  ROS:   Please see the history of present illness.     All other systems reviewed and are negative.  EKGs/Labs/Other Studies Reviewed:    The following studies were reviewed today:  EKG:   01/05/21: SR rate 64 RBBB  CAC Date: 12/24/20 Results: Coronary calcium score of 118. This was 36th percentile for age and sex matched control   Recent Labs: 02/26/2021: ALT 13; BUN 16; Creatinine, Ser 1.19; Potassium 4.0; Sodium 140  Recent Lipid Panel    Component Value Date/Time   CHOL 154 02/26/2021 1015   TRIG 114.0 02/26/2021 1015   HDL 46.40 02/26/2021 1015   CHOLHDL 3 02/26/2021 1015   VLDL 22.8 02/26/2021 1015   LDLCALC 85 02/26/2021 1015    Risk Assessment/Calculations:     N/A  Physical Exam:    VS:  BP 130/70   Pulse (!) 55   Ht 6' (1.829 m)   Wt 87.1 kg   SpO2 95%   BMI 26.04 kg/m     Wt Readings from Last 3 Encounters:  05/07/21 87.1 kg  03/12/21 85.3 kg  02/26/21 85.3 kg     GEN:  Well nourished, well developed in no acute distress HEENT: Normal NECK: No JVD; No carotid bruits  LYMPHATICS: No lymphadenopathy CARDIAC: RRR, no murmurs, rubs, gallops RESPIRATORY:  Clear to auscultation without rales, wheezing or rhonchi  ABDOMEN: Soft, non-tender, non-distended MUSCULOSKELETAL:  No edema; No deformity  SKIN: Warm and dry NEUROLOGIC:  Alert and oriented x 3 PSYCHIATRIC:  Normal affect   ASSESSMENT:    1. 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 RBBB   3. Prostate cancer (HCC)   4. Chest pain of uncertain etiology     PLAN:    In order of problems listed above:  Coronary Artery Calcification HLD -LDL goal less than 70 - checking lipids LFTs today (ordered), with plans to change statin if needed (rosuvastatin 10 mg PO Daily) - gave education on exercise  Non-cardiac CP RBBB - discussed cardiac CP and that  we have a low threshold to do stress testing; patient notes a prior stress test in [place] that was negative in 2021 prior to Prostate Interventions  Prostate Cancer - No chemotherapy or radiation that would increase cardiac risk  One year follow up unless new symptoms or abnormal test results warranting change in plan  Would be reasonable for APP Follow up    Medication Adjustments/Labs and Tests Ordered: Current medicines are reviewed at length with the patient today.  Concerns regarding medicines are outlined above.  No  orders of the defined types were placed in this encounter.  No orders of the defined types were placed in this encounter.   Patient Instructions  Medication Instructions:  Your physician recommends that you continue on your current medications as directed. Please refer to the Current Medication list given to you today.  *If you need a refill on your cardiac medications before your next appointment, please call your pharmacy*   Lab Work: NONE If you have labs (blood work) drawn today and your tests are completely normal, you will receive your results only by: [place] (if you have MyChart) OR A paper copy in the mail If you have any lab test that is abnormal or we need to change your treatment, we will call you to review the results.   Testing/Procedures: NONE   Follow-Up: At [facility], you and your health needs are our priority.  As part of our continuing mission to provide you with exceptional heart care, we have created designated Provider Care Teams.  These Care Teams include your primary Cardiologist (physician) and Advanced Practice Providers (APPs -  Physician Assistants and Nurse Practitioners) who all work together to provide you with the care you need, when you need it.  We recommend signing up for the patient portal called "MyChart".  Sign up information is provided on this After Visit Summary.  MyChart is used to connect with patients  for Virtual Visits (Telemedicine).  Patients are able to view lab/test results, encounter notes, upcoming appointments, etc.  Non-urgent messages can be sent to your provider as well.   To learn more about what you can do with MyChart, go to [email or web address].    Your next appointment:   1 year(s)  The format for your next appointment:   In Person  Provider:   You may see Werner Lean, MD or one of the following Advanced Practice Providers on your designated Care Team:   Melina Copa, PA-C Ermalinda Barrios, PA-C     Signed, Werner Lean, MD  05/07/2021 8:40 AM    [place]

## 2021-05-07 ENCOUNTER — Other Ambulatory Visit: Payer: Self-pay

## 2021-05-07 ENCOUNTER — Other Ambulatory Visit: Payer: Medicare Other | Admitting: *Deleted

## 2021-05-07 ENCOUNTER — Ambulatory Visit (INDEPENDENT_AMBULATORY_CARE_PROVIDER_SITE_OTHER): Payer: Medicare Other | Admitting: Internal Medicine

## 2021-05-07 ENCOUNTER — Encounter: Payer: Self-pay | Admitting: Internal Medicine

## 2021-05-07 VITALS — BP 130/70 | HR 55 | Ht 72.0 in | Wt 192.0 lb

## 2021-05-07 DIAGNOSIS — I2583 Coronary atherosclerosis due to lipid rich plaque: Secondary | ICD-10-CM

## 2021-05-07 DIAGNOSIS — R079 Chest pain, unspecified: Secondary | ICD-10-CM

## 2021-05-07 DIAGNOSIS — C61 Malignant neoplasm of prostate: Secondary | ICD-10-CM

## 2021-05-07 DIAGNOSIS — I4510 Unspecified right bundle-branch block: Secondary | ICD-10-CM | POA: Diagnosis not present

## 2021-05-07 DIAGNOSIS — I2510 Atherosclerotic heart disease of native coronary artery without angina pectoris: Secondary | ICD-10-CM | POA: Diagnosis not present

## 2021-05-07 DIAGNOSIS — E785 Hyperlipidemia, unspecified: Secondary | ICD-10-CM

## 2021-05-07 LAB — LIPID PANEL
Chol/HDL Ratio: 3.4 ratio (ref 0.0–5.0)
Cholesterol, Total: 154 mg/dL (ref 100–199)
HDL: 45 mg/dL (ref >39)
LDL Chol Calc (NIH): 88 mg/dL (ref 0–99)
Triglycerides: 117 mg/dL (ref 0–149)
VLDL Cholesterol Cal: 21 mg/dL (ref 5–40)

## 2021-05-07 LAB — HEPATIC FUNCTION PANEL
ALT: 12 IU/L (ref 0–44)
AST: 16 IU/L (ref 0–40)
Albumin: 4.3 g/dL (ref 3.7–4.7)
Alkaline Phosphatase: 92 IU/L (ref 44–121)
Bilirubin Total: 1.6 mg/dL — ABNORMAL HIGH (ref 0.0–1.2)
Bilirubin, Direct: 0.32 mg/dL (ref 0.00–0.40)
Total Protein: 6.6 g/dL (ref 6.0–8.5)

## 2021-05-07 NOTE — Patient Instructions (Signed)
Medication Instructions:  Your physician recommends that you continue on your current medications as directed. Please refer to the Current Medication list given to you today.  *If you need a refill on your cardiac medications before your next appointment, please call your pharmacy*   Lab Work: NONE If you have labs (blood work) drawn today and your tests are completely normal, you will receive your results only by: . MyChart Message (if you have MyChart) OR . A paper copy in the mail If you have any lab test that is abnormal or we need to change your treatment, we will call you to review the results.   Testing/Procedures: NONE   Follow-Up: At [facility], you and your health needs are our priority.  As part of our continuing mission to provide you with exceptional heart care, we have created designated Provider Care Teams.  These Care Teams include your primary Cardiologist (physician) and Advanced Practice Providers (APPs -  Physician Assistants and Nurse Practitioners) who all work together to provide you with the care you need, when you need it.  We recommend signing up for the patient portal called "MyChart".  Sign up information is provided on this After Visit Summary.  MyChart is used to connect with patients for Virtual Visits (Telemedicine).  Patients are able to view lab/test results, encounter notes, upcoming appointments, etc.  Non-urgent messages can be sent to your provider as well.   To learn more about what you can do with MyChart, go to [email or web address].    Your next appointment:   1 year(s)  The format for your next appointment:   In Person  Provider:   You may see Mahesh A Chandrasekhar, MD or one of the following Advanced Practice Providers on your designated Care Team:    Dayna Dunn, PA-C  Michele Lenze, PA-C       

## 2021-05-08 ENCOUNTER — Telehealth: Payer: Self-pay

## 2021-05-08 MED ORDER — ROSUVASTATIN CALCIUM 10 MG PO TABS: 10.0000 mg | ORAL_TABLET | Freq: Every day | ORAL | 3 refills | Status: DC

## 2021-05-08 NOTE — Telephone Encounter (Signed)
-----   Message from Werner Lean, MD sent at 05/08/2021  8:23 AM EDT ----- Results: LDL above goal Plan: Increase to rosuvastatin 10 mg PO Daily  Werner Lean, MD

## 2021-05-08 NOTE — Telephone Encounter (Signed)
Called patient reviewed results and MD recommendations.  He is agreeable to plan, orders placed and sent to pharmacy of pt choice.

## 2021-07-22 NOTE — Progress Notes (Signed)
[place] Phone: [phone] Subjective:   Fontaine No, am serving as a scribe for Dr. Hulan Saas. This visit occurred during the SARS-CoV-2 public health emergency.  Safety protocols were in place, including screening questions prior to the visit, additional usage of staff PPE, and extensive cleaning of exam room while observing appropriate contact time as indicated for disinfecting solutions.   I'm seeing this patient by the request  of:  Plotnikov, Evie Lacks, MD  CC: Right foot pain  [id number]  Cristiano Akkerman is a 75 y.o. male coming in with complaint of R plantar fasicitis. Patient using Advil for pain relief. Pain shoots from heel to toes with weight bearing. Pain improves the more that he moves around. Has been working in the yard this morning and sat down and when he got up and his pain had increased. Patient use to walk 4-5 miles a day. No history of injury/surgery to this foot.       Past Medical History:  Diagnosis Date   Arthritis    Cataract    removed bilat 2014 with lens implants    Colon polyps    Deviated septum    GERD (gastroesophageal reflux disease)    Hyperlipidemia    preventative for FH    Prostate cancer ([place]) 2021   04-14-2020 prostate surgery    Past Surgical History:  Procedure Laterality Date   CATARACT EXTRACTION, BILATERAL     COLONOSCOPY     KNEE ARTHROSCOPY Right    NASAL SEPTUM SURGERY     POLYPECTOMY     PROSTATECTOMY  04/14/2020   SHOULDER ARTHROSCOPY Right    TONSILLECTOMY     Social History   Socioeconomic History   Marital status: Divorced    Spouse name: Not on file   Number of children: 0   Years of education: Not on file   Highest education level: Not on file  Occupational History   Occupation: retired  Tobacco Use   Smoking status: Never   Smokeless tobacco: Never  Vaping Use   Vaping Use: Never used  Substance and Sexual Activity    Alcohol use: Never   Drug use: Never   Sexual activity: Not Currently  Other Topics Concern   Not on file  Social History Narrative   Not on file   Social Determinants of Health   Financial Resource Strain: Not on file  Food Insecurity: Not on file  Transportation Needs: Not on file  Physical Activity: Inactive   Days of Exercise per Week: 0 days   Minutes of Exercise per Session: 0 min  Stress: Not on file  Social Connections: Not on file   No Known Allergies Family History  Problem Relation Age of Onset   Alzheimer's disease Mother    Alzheimer's disease Brother    Heart disease Brother    Heart disease Brother    Colon cancer Neg Hx    Colon polyps Neg Hx    Esophageal cancer Neg Hx    Rectal cancer Neg Hx    Stomach cancer Neg Hx      Current Outpatient Medications (Cardiovascular):    rosuvastatin (CRESTOR) 10 MG tablet, Take 1 tablet (10 mg total) by mouth daily.   sildenafil (VIAGRA) 100 MG tablet, Take 100 mg by mouth daily as needed for erectile dysfunction.  Current Outpatient Medications (Respiratory):    Hypertonic Nasal Wash (SINUS RINSE) PACK, Place into the nose daily.  Current Outpatient Medications (Analgesics):    aspirin EC 81 MG tablet, Take 1 tablet (81 mg total) by mouth daily.   Current Outpatient Medications (Other):    Cholecalciferol (VITAMIN D3) 50 MCG (2000 UT) capsule, Take 1 capsule (2,000 Units total) by mouth daily.   ketoconazole (NIZORAL) 2 % cream, APPLY A SMALL AMOUNT TOPICALLY BETWEEN THE TOES AND TO AFFECTED AREA(S) TWO TIMES A DAY AS NEEDED   Niacinamide 500 MG TBCR, Take 500 mg by mouth daily.   omeprazole (PRILOSEC) 20 MG capsule, Take 1 capsule (20 mg total) by mouth daily.   Reviewed prior external information including notes and imaging from  primary care provider As well as notes that were available from care everywhere and other healthcare systems.  Past medical history, social, surgical and family history all  reviewed in electronic medical record.  No pertanent information unless stated regarding to the chief complaint.   Review of Systems:  No headache, visual changes, nausea, vomiting, diarrhea, constipation, dizziness, abdominal pain, skin rash, fevers, chills, night sweats, weight loss, swollen lymph nodes, body aches, joint swelling, chest pain, shortness of breath, mood changes. POSITIVE muscle aches  Objective  Blood pressure 132/82, pulse 81, height 6' (1.829 m), weight 198 lb (89.8 kg), SpO2 96 %.   General: No apparent distress alert and oriented x3 mood and affect normal, dressed appropriately.  HEENT: Pupils equal, extraocular movements intact  Respiratory: Patient's speak in full sentences and does not appear short of breath  Cardiovascular: No lower extremity edema, non tender, no erythema  Gait antalgic gait noted. Right heel exam shows the patient does have tenderness to palpation in the medial calcaneal area.  Patient does have some mild pain on the plantar aspect as well.  Patient does have actually a high instep.  Breakdown of the transverse arch noted.  Patient does have a onychomycosis of multiple toes.  Limited muscular skeletal ultrasound was performed and interpreted by Hulan Saas, M Limited musculoskeletal ultrasound shows the patient does have hypoechoic changes surrounding the plantar fascia but no intrasubstance tearing noted.  No cortical irregularity noted of the underlying calcaneal area.  Achilles appears to be unremarkable. Impression: Plantar fasciitis   Impression and Recommendations:     The above documentation has been reviewed and is accurate and complete Lyndal Pulley, DO

## 2021-07-23 ENCOUNTER — Ambulatory Visit: Payer: Self-pay

## 2021-07-23 ENCOUNTER — Other Ambulatory Visit: Payer: Self-pay

## 2021-07-23 ENCOUNTER — Ambulatory Visit (INDEPENDENT_AMBULATORY_CARE_PROVIDER_SITE_OTHER): Payer: Medicare Other | Admitting: Family Medicine

## 2021-07-23 ENCOUNTER — Encounter: Payer: Self-pay | Admitting: Family Medicine

## 2021-07-23 VITALS — BP 132/82 | HR 81 | Ht 72.0 in | Wt 198.0 lb

## 2021-07-23 DIAGNOSIS — M79671 Pain in right foot: Secondary | ICD-10-CM | POA: Diagnosis not present

## 2021-07-23 DIAGNOSIS — M722 Plantar fascial fibromatosis: Secondary | ICD-10-CM

## 2021-07-23 DIAGNOSIS — M79672 Pain in left foot: Secondary | ICD-10-CM

## 2021-07-23 NOTE — Patient Instructions (Signed)
Exercises OOFOS or HOKA recovery sandals [employer] Total Support Ice after exercise See me again in 6 weeks

## 2021-07-23 NOTE — Assessment & Plan Note (Signed)
Plan fasciitis.  Discussed with patient about icing regimen and home exercises.  Discussed with patient about inside avoiding being barefoot.  Increase activity slowly.  Discussed icing regimen.  Follow-up with me again 6 weeks.  Worsening pain will consider formal physical therapy or even potentially custom orthotics.

## 2021-08-03 ENCOUNTER — Ambulatory Visit (INDEPENDENT_AMBULATORY_CARE_PROVIDER_SITE_OTHER): Payer: Medicare Other

## 2021-08-03 DIAGNOSIS — Z0000 Encounter for general adult medical examination without abnormal findings: Secondary | ICD-10-CM | POA: Diagnosis not present

## 2021-08-03 NOTE — Patient Instructions (Signed)
Mr. Shawn Orozco , Thank you for taking time to come for your Medicare Wellness Visit. I appreciate your ongoing commitment to your health goals. Please review the following plan we discussed and let me know if I can assist you in the future.   Screening recommendations/referrals: Colonoscopy: 03/12/2021; due every 5 years Recommended yearly ophthalmology/optometry visit for glaucoma screening and checkup Recommended yearly dental visit for hygiene and checkup  Vaccinations: Influenza vaccine: declined Pneumococcal vaccine: 12/15/2020; due 12/15/2021 Tdap vaccine: declined Shingles vaccine: declined   Covid-19: 02/06/2020, 03/07/2020, 10/07/2020  Advanced directives: Please bring a copy of your health care power of attorney and living will to the office at your convenience.  Conditions/risks identified: Yes; Client understands the importance of follow-up with providers by attending scheduled visits and discussed goals to eat healthier, increase physical activity, exercise the brain, socialize more, get enough sleep and make time for laughter.  Next appointment: Please schedule your next Medicare Wellness Visit with your Nurse Health Advisor in 1 year by calling [phone].  Preventive Care 70 Years and Older, Male Preventive care refers to lifestyle choices and visits with your health care provider that can promote health and wellness. What does preventive care include? A yearly physical exam. This is also called an annual well check. Dental exams once or twice a year. Routine eye exams. Ask your health care provider how often you should have your eyes checked. Personal lifestyle choices, including: Daily care of your teeth and gums. Regular physical activity. Eating a healthy diet. Avoiding tobacco and drug use. Limiting alcohol use. Practicing safe sex. Taking low doses of aspirin every day. Taking vitamin and mineral supplements as recommended by your health care provider. What happens  during an annual well check? The services and screenings done by your health care provider during your annual well check will depend on your age, overall health, lifestyle risk factors, and family history of disease. Counseling  Your health care provider may ask you questions about your: Alcohol use. Tobacco use. Drug use. Emotional well-being. Home and relationship well-being. Sexual activity. Eating habits. History of falls. Memory and ability to understand (cognition). Work and work Statistician. Screening  You may have the following tests or measurements: Height, weight, and BMI. Blood pressure. Lipid and cholesterol levels. These may be checked every 5 years, or more frequently if you are over 69 years old. Skin check. Lung cancer screening. You may have this screening every year starting at age 32 if you have a 30-pack-year history of smoking and currently smoke or have quit within the past 15 years. Fecal occult blood test (FOBT) of the stool. You may have this test every year starting at age 74. Flexible sigmoidoscopy or colonoscopy. You may have a sigmoidoscopy every 5 years or a colonoscopy every 10 years starting at age 42. Prostate cancer screening. Recommendations will vary depending on your family history and other risks. Hepatitis C blood test. Hepatitis B blood test. Sexually transmitted disease (STD) testing. Diabetes screening. This is done by checking your blood sugar (glucose) after you have not eaten for a while (fasting). You may have this done every 1-3 years. Abdominal aortic aneurysm (AAA) screening. You may need this if you are a current or former smoker. Osteoporosis. You may be screened starting at age 52 if you are at high risk. Talk with your health care provider about your test results, treatment options, and if necessary, the need for more tests. Vaccines  Your health care provider may recommend certain vaccines, such as:  Influenza vaccine. This is  recommended every year. Tetanus, diphtheria, and acellular pertussis (Tdap, Td) vaccine. You may need a Td booster every 10 years. Zoster vaccine. You may need this after age 8. Pneumococcal 13-valent conjugate (PCV13) vaccine. One dose is recommended after age 90. Pneumococcal polysaccharide (PPSV23) vaccine. One dose is recommended after age 72. Talk to your health care provider about which screenings and vaccines you need and how often you need them. This information is not intended to replace advice given to you by your health care provider. Make sure you discuss any questions you have with your health care provider. Document Released: 12/05/2015 Document Revised: 07/28/2016 Document Reviewed: 09/09/2015 Elsevier Interactive Patient Education  2017 [place] Prevention in the Home Falls can cause injuries. They can happen to people of all ages. There are many things you can do to make your home safe and to help prevent falls. What can I do on the outside of my home? Regularly fix the edges of walkways and driveways and fix any cracks. Remove anything that might make you trip as you walk through a door, such as a raised step or threshold. Trim any bushes or trees on the path to your home. Use bright outdoor lighting. Clear any walking paths of anything that might make someone trip, such as rocks or tools. Regularly check to see if handrails are loose or broken. Make sure that both sides of any steps have handrails. Any raised decks and porches should have guardrails on the edges. Have any leaves, snow, or ice cleared regularly. Use sand or salt on walking paths during winter. Clean up any spills in your garage right away. This includes oil or grease spills. What can I do in the bathroom? Use night lights. Install grab bars by the toilet and in the tub and shower. Do not use towel bars as grab bars. Use non-skid mats or decals in the tub or shower. If you need to sit down in  the shower, use a plastic, non-slip stool. Keep the floor dry. Clean up any water that spills on the floor as soon as it happens. Remove soap buildup in the tub or shower regularly. Attach bath mats securely with double-sided non-slip rug tape. Do not have throw rugs and other things on the floor that can make you trip. What can I do in the bedroom? Use night lights. Make sure that you have a light by your bed that is easy to reach. Do not use any sheets or blankets that are too big for your bed. They should not hang down onto the floor. Have a firm chair that has side arms. You can use this for support while you get dressed. Do not have throw rugs and other things on the floor that can make you trip. What can I do in the kitchen? Clean up any spills right away. Avoid walking on wet floors. Keep items that you use a lot in easy-to-reach places. If you need to reach something above you, use a strong step stool that has a grab bar. Keep electrical cords out of the way. Do not use floor polish or wax that makes floors slippery. If you must use wax, use non-skid floor wax. Do not have throw rugs and other things on the floor that can make you trip. What can I do with my stairs? Do not leave any items on the stairs. Make sure that there are handrails on both sides of the stairs and use them.  Fix handrails that are broken or loose. Make sure that handrails are as long as the stairways. Check any carpeting to make sure that it is firmly attached to the stairs. Fix any carpet that is loose or worn. Avoid having throw rugs at the top or bottom of the stairs. If you do have throw rugs, attach them to the floor with carpet tape. Make sure that you have a light switch at the top of the stairs and the bottom of the stairs. If you do not have them, ask someone to add them for you. What else can I do to help prevent falls? Wear shoes that: Do not have high heels. Have rubber bottoms. Are comfortable  and fit you well. Are closed at the toe. Do not wear sandals. If you use a stepladder: Make sure that it is fully opened. Do not climb a closed stepladder. Make sure that both sides of the stepladder are locked into place. Ask someone to hold it for you, if possible. Clearly mark and make sure that you can see: Any grab bars or handrails. First and last steps. Where the edge of each step is. Use tools that help you move around (mobility aids) if they are needed. These include: Canes. Walkers. Scooters. Crutches. Turn on the lights when you go into a dark area. Replace any light bulbs as soon as they burn out. Set up your furniture so you have a clear path. Avoid moving your furniture around. If any of your floors are uneven, fix them. If there are any pets around you, be aware of where they are. Review your medicines with your doctor. Some medicines can make you feel dizzy. This can increase your chance of falling. Ask your doctor what other things that you can do to help prevent falls. This information is not intended to replace advice given to you by your health care provider. Make sure you discuss any questions you have with your health care provider. Document Released: 09/04/2009 Document Revised: 04/15/2016 Document Reviewed: 12/13/2014 Elsevier Interactive Patient Education  2017 [employer].

## 2021-08-03 NOTE — Progress Notes (Addendum)
I connected with Shawn Orozco today by telephone and verified that I am speaking with the correct person using two identifiers. Location patient: home Location provider: work Persons participating in the virtual visit: patient, provider.   I discussed the limitations, risks, security and privacy concerns of performing an evaluation and management service by telephone and the availability of in person appointments. I also discussed with the patient that there may be a patient responsible charge related to this service. The patient expressed understanding and verbally consented to this telephonic visit.    Interactive audio and video telecommunications were attempted between this provider and patient, however failed, due to patient having technical difficulties OR patient did not have access to video capability.  We continued and completed visit with audio only.  Some vital signs may be absent or patient reported.   Time Spent with patient on telephone encounter: 30 minutes  Subjective:   Shawn Orozco is a 75 y.o. male who presents for Medicare Annual/Subsequent preventive examination.  Review of Systems     Cardiac Risk Factors include: advanced age (>80mn, >>71women);dyslipidemia;family history of premature cardiovascular disease;male gender     Objective:    There were no vitals filed for this visit. There is no height or weight on file to calculate BMI.  Advanced Directives 08/03/2021  Does Patient Have a Medical Advance Directive? Yes  Type of Advance Directive Living will;Healthcare Power of Attorney  Does patient want to make changes to medical advance directive? No - Patient declined  [place]? No - copy requested    Current Medications (verified) Outpatient Encounter Medications as of 08/03/2021  Medication Sig   aspirin EC 81 MG tablet Take 1 tablet (81 mg total) by mouth daily.   Cholecalciferol (VITAMIN D3) 50 MCG (2000 UT) capsule  Take 1 capsule (2,000 Units total) by mouth daily.   Hypertonic Nasal Wash (SINUS RINSE) PACK Place into the nose daily.   ketoconazole (NIZORAL) 2 % cream APPLY A SMALL AMOUNT TOPICALLY BETWEEN THE TOES AND TO AFFECTED AREA(S) TWO TIMES A DAY AS NEEDED   Niacinamide 500 MG TBCR Take 500 mg by mouth daily.   omeprazole (PRILOSEC) 20 MG capsule Take 1 capsule (20 mg total) by mouth daily.   rosuvastatin (CRESTOR) 10 MG tablet Take 1 tablet (10 mg total) by mouth daily.   sildenafil (VIAGRA) 100 MG tablet Take 100 mg by mouth daily as needed for erectile dysfunction.   No facility-administered encounter medications on file as of 08/03/2021.    Allergies (verified) Patient has no known allergies.   History: Past Medical History:  Diagnosis Date   Arthritis    Cataract    removed bilat 2014 with lens implants    Colon polyps    Deviated septum    GERD (gastroesophageal reflux disease)    Hyperlipidemia    preventative for FH    Prostate cancer ([place] 2021   04-14-2020 prostate surgery    Past Surgical History:  Procedure Laterality Date   CATARACT EXTRACTION, BILATERAL     COLONOSCOPY     KNEE ARTHROSCOPY Right    NASAL SEPTUM SURGERY     POLYPECTOMY     PROSTATECTOMY  04/14/2020   SHOULDER ARTHROSCOPY Right    TONSILLECTOMY     Family History  Problem Relation Age of Onset   Alzheimer's disease Mother    Alzheimer's disease Brother    Heart disease Brother    Heart disease Brother  Colon cancer Neg Hx    Colon polyps Neg Hx    Esophageal cancer Neg Hx    Rectal cancer Neg Hx    Stomach cancer Neg Hx    Social History   Socioeconomic History   Marital status: Divorced    Spouse name: Not on file   Number of children: 0   Years of education: Not on file   Highest education level: Not on file  Occupational History   Occupation: retired  Tobacco Use   Smoking status: Never   Smokeless tobacco: Never  Vaping Use   Vaping Use: Never used  Substance and Sexual  Activity   Alcohol use: Never   Drug use: Never   Sexual activity: Not Currently  Other Topics Concern   Not on file  Social History Narrative   Not on file   Social Determinants of Health   Financial Resource Strain: Low Risk    Difficulty of Paying Living Expenses: Not hard at all  Food Insecurity: No Food Insecurity   Worried About Charity fundraiser in the Last Year: Never true   [place] in the Last Year: Never true  Transportation Needs: No Transportation Needs   Lack of Transportation (Medical): No   Lack of Transportation (Non-Medical): No  Physical Activity: Inactive   Days of Exercise per Week: 0 days   Minutes of Exercise per Session: 0 min  Stress: No Stress Concern Present   Feeling of Stress : Not at all  Social Connections: Moderately Integrated   Frequency of Communication with Friends and Family: More than three times a week   Frequency of Social Gatherings with Friends and Family: More than three times a week   Attends Religious Services: More than 4 times per year   Active Member of [employer] or Organizations: Yes   Attends Music therapist: More than 4 times per year   Marital Status: Never married    Tobacco Counseling Counseling given: Not Answered   Clinical Intake:  Pre-visit preparation completed: Yes  Pain : No/denies pain     Nutritional Risks: None Diabetes: No  How often do you need to have someone help you when you read instructions, pamphlets, or other written materials from your doctor or pharmacy?: 1 - Never What is the last grade level you completed in school?: [employer]; [employer]  Diabetic? no  Interpreter Needed?: No  Information entered by :: Lisette Abu, LPN   Activities of Daily Living In your present state of health, do you have any difficulty performing the following activities: 08/03/2021 12/15/2020  Hearing? N N  Vision? N N  Difficulty concentrating or making decisions? N N  Walking or climbing  stairs? N N  Dressing or bathing? N N  Doing errands, shopping? N N  Preparing Food and eating ? N -  Using the Toilet? N -  In the past six months, have you accidently leaked urine? N -  Do you have problems with loss of bowel control? N -  Managing your Medications? N -  Managing your Finances? N -  Housekeeping or managing your Housekeeping? N -    Patient Care Team: Plotnikov, Evie Lacks, MD as PCP - General (Internal Medicine) Werner Lean, MD as PCP - Cardiology (Cardiology) Remi Haggard, MD as Consulting Physician (Urology) Armbruster, Carlota Raspberry, MD as Consulting Physician (Gastroenterology) Werner Lean, MD as Consulting Physician (Cardiology) Marica Otter, OD as Consulting Physician (Optometry)  Indicate any recent Medical  Services you may have received from other than [facility] providers in the past year (date may be approximate).     Assessment:   This is a routine wellness examination for [place].  Hearing/Vision screen Hearing Screening - Comments:: Patient denied any hearing difficulty. Vision Screening - Comments:: Patient has had lens implants; only readers for reading.  [facility] exam done by Dr[facility] Marica Otter with [facility]  Dietary issues and exercise activities discussed: Current Exercise Habits: The patient does not participate in regular exercise at present, Exercise limited by: orthopedic condition(s) (Plantar fasciitis)   Goals Addressed   None   Depression Screen PHQ 2/9 Scores 08/03/2021 12/15/2020  PHQ - 2 Score 0 0    Fall Risk Fall Risk  08/03/2021 02/26/2021  Falls in the past year? 0 0  Number falls in past yr: 0 0  Injury with Fall? 0 0  Risk for fall due to : No Fall Risks No Fall Risks  Follow up Falls evaluation completed -    FALL RISK PREVENTION PERTAINING TO THE HOME:  Any stairs in or around the home? No  If so, are there any without handrails? No  Home free of loose throw rugs in walkways, pet beds,  electrical cords, etc? Yes  Adequate lighting in your home to reduce risk of falls? Yes   ASSISTIVE DEVICES UTILIZED TO PREVENT FALLS:  Life alert? No  Use of a cane, walker or w/c? No  Grab bars in the bathroom? No  Shower chair or bench in shower? No  Elevated toilet seat or a handicapped toilet? No   TIMED UP AND GO:  Was the test performed? No .  Length of time to ambulate 10 feet: n/a sec.   Gait steady and fast without use of assistive device  Cognitive Function: Normal cognitive status assessed by direct observation by this Nurse Health Advisor. No abnormalities found.          Immunizations Immunization History  Administered Date(s) Administered   [employer] Booster Vaccination 10/07/2020   Moderna Sars-Covid-2 Vaccination 02/06/2020, 03/07/2020   Pneumococcal Conjugate-13 12/15/2020    TDAP status: declined  Flu Vaccine status: Declined, Education has been provided regarding the importance of this vaccine but patient still declined. Advised may receive this vaccine at local pharmacy or Health Dept. Aware to provide a copy of the vaccination record if obtained from local pharmacy or Health Dept. Verbalized acceptance and understanding.  Pneumococcal vaccine status: Up to date  Covid-19 vaccine status: Completed vaccines  Qualifies for Shingles Vaccine? Yes   Zostavax completed No   Shingrix Completed?: No.    Education has been provided regarding the importance of this vaccine. Patient has been advised to call insurance company to determine out of pocket expense if they have not yet received this vaccine. Advised may also receive vaccine at local pharmacy or Health Dept. Verbalized acceptance and understanding.  Screening Tests Health Maintenance  Topic Date Due   Hepatitis C Screening  Never done   TETANUS/TDAP  Never done   Zoster Vaccines- Shingrix (1 of 2) Never done   COVID-19 Vaccine (4 - Booster for Moderna series) 12/30/2020   INFLUENZA  VACCINE  Never done   PNA vac Low Risk Adult (2 of 2 - PPSV23) 12/15/2021   COLONOSCOPY (Pts 45-45yr Insurance coverage will need to be confirmed)  03/12/2026   HPV VACCINES  Aged Out    Health Maintenance  Health Maintenance Due  Topic Date Due   Hepatitis C Screening  Never done   TETANUS/TDAP  Never done   Zoster Vaccines- Shingrix (1 of 2) Never done   COVID-19 Vaccine (4 - Booster for Moderna series) 12/30/2020   INFLUENZA VACCINE  Never done    Colorectal cancer screening: Type of screening: Colonoscopy. Completed 03/12/2021. Repeat every 5 years  Lung Cancer Screening: (Low Dose CT Chest recommended if Age 8-80 years, 30 pack-year currently smoking OR have quit w/in 15years.) does not qualify.   Lung Cancer Screening Referral: no  Additional Screening:  Hepatitis C Screening: does qualify; Completed no  Vision Screening: Recommended annual ophthalmology exams for early detection of glaucoma and other disorders of the eye. Is the patient up to date with their annual eye exam?  Yes  Who is the provider or what is the name of the office in which the patient attends annual eye exams? Dr. Marica Otter If pt is not established with a provider, would they like to be referred to a provider to establish care? No .   Dental Screening: Recommended annual dental exams for proper oral hygiene  Community Resource Referral / Chronic Care Management: CRR required this visit?  No   CCM required this visit?  No      Plan:     I have personally reviewed and noted the following in the patient's chart:   Medical and social history Use of alcohol, tobacco or illicit drugs  Current medications and supplements including opioid prescriptions. Patient is not currently taking opioid prescriptions. Functional ability and status Nutritional status Physical activity Advanced directives List of other physicians Hospitalizations, surgeries, and ER visits in previous 12  months Vitals Screenings to include cognitive, depression, and falls Referrals and appointments  In addition, I have reviewed and discussed with patient certain preventive protocols, quality metrics, and best practice recommendations. A written personalized care plan for preventive services as well as general preventive health recommendations were provided to patient.     Sheral Flow, LPN   [id number]   Nurse Notes:  Hearing Screening - Comments:: Patient denied any hearing difficulty. Vision Screening - Comments:: Patient has had lens implants; only readers for reading.  [facility]. Patient is cogitatively intact. There were no vitals filed for this visit. There is no height or weight on file to calculate BMI.  Medical screening examination/treatment/procedure(s) were performed by non-physician practitioner and as supervising physician I was immediately available for consultation/collaboration.  I agree with above. Lew Dawes, MD

## 2021-09-01 ENCOUNTER — Encounter: Payer: Self-pay | Admitting: Internal Medicine

## 2021-09-01 ENCOUNTER — Ambulatory Visit (INDEPENDENT_AMBULATORY_CARE_PROVIDER_SITE_OTHER): Payer: Medicare Other | Admitting: Internal Medicine

## 2021-09-01 ENCOUNTER — Other Ambulatory Visit: Payer: Self-pay

## 2021-09-01 VITALS — BP 130/68 | HR 53 | Temp 98.1°F | Ht 72.0 in | Wt 193.8 lb

## 2021-09-01 DIAGNOSIS — Z23 Encounter for immunization: Secondary | ICD-10-CM | POA: Diagnosis not present

## 2021-09-01 DIAGNOSIS — E785 Hyperlipidemia, unspecified: Secondary | ICD-10-CM

## 2021-09-01 DIAGNOSIS — I2510 Atherosclerotic heart disease of native coronary artery without angina pectoris: Secondary | ICD-10-CM

## 2021-09-01 DIAGNOSIS — I2583 Coronary atherosclerosis due to lipid rich plaque: Secondary | ICD-10-CM

## 2021-09-01 DIAGNOSIS — M722 Plantar fascial fibromatosis: Secondary | ICD-10-CM

## 2021-09-01 LAB — COMPREHENSIVE METABOLIC PANEL
ALT: 15 U/L (ref 0–53)
AST: 19 U/L (ref 0–37)
Albumin: 4.3 g/dL (ref 3.5–5.2)
Alkaline Phosphatase: 73 U/L (ref 39–117)
BUN: 22 mg/dL (ref 6–23)
CO2: 28 mEq/L (ref 19–32)
Calcium: 9.8 mg/dL (ref 8.4–10.5)
Chloride: 105 mEq/L (ref 96–112)
Creatinine, Ser: 1.17 mg/dL (ref 0.40–1.50)
GFR: 60.85 mL/min (ref >60.00)
Glucose, Bld: 119 mg/dL — ABNORMAL HIGH (ref 70–99)
Potassium: 4.6 mEq/L (ref 3.5–5.1)
Sodium: 140 mEq/L (ref 135–145)
Total Bilirubin: 1.8 mg/dL — ABNORMAL HIGH (ref 0.2–1.2)
Total Protein: 7.3 g/dL (ref 6.0–8.3)

## 2021-09-01 LAB — LIPID PANEL
Cholesterol: 145 mg/dL (ref 0–200)
HDL: 46.2 mg/dL (ref >39.00)
LDL Cholesterol: 76 mg/dL (ref 0–99)
NonHDL: 99.01
Total CHOL/HDL Ratio: 3
Triglycerides: 113 mg/dL (ref 0.0–149.0)
VLDL: 22.6 mg/dL (ref 0.0–40.0)

## 2021-09-01 LAB — TSH: TSH: 2.57 u[IU]/mL (ref 0.35–5.50)

## 2021-09-01 NOTE — Progress Notes (Signed)
Subjective:  Patient ID: Shawn Orozco, male    DOB: 11-27-1945  Age: 75 y.o. MRN: [id number]  CC: Follow-up (6 month f/u- Flu shot)   HPI Shawn Orozco presents for R heel pain, dyslipidemia, CAD  Outpatient Medications Prior to Visit  Medication Sig Dispense Refill   aspirin EC 81 MG tablet Take 1 tablet (81 mg total) by mouth daily. 100 tablet 3   Cholecalciferol (VITAMIN D3) 50 MCG (2000 UT) capsule Take 1 capsule (2,000 Units total) by mouth daily. 100 capsule 3   Hypertonic Nasal Wash (SINUS RINSE) PACK Place into the nose daily.     ketoconazole (NIZORAL) 2 % cream APPLY A SMALL AMOUNT TOPICALLY BETWEEN THE TOES AND TO AFFECTED AREA(S) TWO TIMES A DAY AS NEEDED     Niacinamide 500 MG TBCR Take 500 mg by mouth daily.     omeprazole (PRILOSEC) 20 MG capsule Take 1 capsule (20 mg total) by mouth daily. 90 capsule 3   rosuvastatin (CRESTOR) 10 MG tablet Take 1 tablet (10 mg total) by mouth daily. 90 tablet 3   sildenafil (VIAGRA) 100 MG tablet Take 100 mg by mouth daily as needed for erectile dysfunction.     No facility-administered medications prior to visit.    ROS: Review of Systems  Constitutional:  Negative for appetite change, fatigue and unexpected weight change.  HENT:  Negative for congestion, nosebleeds, sneezing, sore throat and trouble swallowing.   Eyes:  Negative for itching and visual disturbance.  Respiratory:  Negative for cough.   Cardiovascular:  Negative for chest pain, palpitations and leg swelling.  Gastrointestinal:  Negative for abdominal distention, blood in stool, diarrhea and nausea.  Genitourinary:  Negative for frequency and hematuria.  Musculoskeletal:  Positive for gait problem. Negative for back pain, joint swelling and neck pain.  Skin:  Negative for rash.  Neurological:  Negative for dizziness, tremors, speech difficulty and weakness.  Psychiatric/Behavioral:  Negative for agitation, dysphoric mood, sleep disturbance and suicidal ideas. The  patient is not nervous/anxious.    Objective:  BP 130/68 (BP Location: Left Arm)   Pulse (!) 53   Temp 98.1 F (36.7 C) (Oral)   Ht 6' (1.829 m)   Wt 193 lb 12.8 oz (87.9 kg)   SpO2 97%   BMI 26.28 kg/m   BP Readings from Last 3 Encounters:  09/01/21 130/68  07/23/21 132/82  05/07/21 130/70    Wt Readings from Last 3 Encounters:  09/01/21 193 lb 12.8 oz (87.9 kg)  07/23/21 198 lb (89.8 kg)  05/07/21 192 lb (87.1 kg)    Physical Exam Constitutional:      General: He is not in acute distress.    Appearance: Normal appearance. He is well-developed.     Comments: NAD  Eyes:     Conjunctiva/sclera: Conjunctivae normal.     Pupils: Pupils are equal, round, and reactive to light.  Neck:     Thyroid: No thyromegaly.     Vascular: No JVD.  Cardiovascular:     Rate and Rhythm: Normal rate and regular rhythm.     Heart sounds: Normal heart sounds. No murmur heard.   No friction rub. No gallop.  Pulmonary:     Effort: Pulmonary effort is normal. No respiratory distress.     Breath sounds: Normal breath sounds. No wheezing or rales.  Chest:     Chest wall: No tenderness.  Abdominal:     General: Bowel sounds are normal. There is no distension.  Palpations: Abdomen is soft. There is no mass.     Tenderness: There is no abdominal tenderness. There is no guarding or rebound.  Musculoskeletal:        General: No tenderness. Normal range of motion.     Cervical back: Normal range of motion.  Lymphadenopathy:     Cervical: No cervical adenopathy.  Skin:    General: Skin is warm and dry.     Findings: No rash.  Neurological:     Mental Status: He is alert and oriented to person, place, and time.     Cranial Nerves: No cranial nerve deficit.     Motor: No abnormal muscle tone.     Coordination: Coordination normal.     Gait: Gait normal.     Deep Tendon Reflexes: Reflexes are normal and symmetric.  Psychiatric:        Behavior: Behavior normal.        Thought Content:  Thought content normal.        Judgment: Judgment normal.  Heels NT  Lab Results  Component Value Date   GLUCOSE 104 (H) 02/26/2021   CHOL 154 05/07/2021   TRIG 117 05/07/2021   HDL 45 05/07/2021   LDLCALC 88 05/07/2021   ALT 12 05/07/2021   AST 16 05/07/2021   NA 140 02/26/2021   K 4.0 02/26/2021   CL 104 02/26/2021   CREATININE 1.19 02/26/2021   BUN 16 02/26/2021   CO2 30 02/26/2021    CT CARDIAC SCORING (SELF PAY ONLY)  Addendum Date: 12/24/2020   ADDENDUM REPORT: 12/24/2020 12:17 CLINICAL DATA:  Risk stratification, CAD screening, 73yr CHD risk < 10%, Dyslipidemia EXAM: Coronary Calcium Score TECHNIQUE: The patient was scanned on a [employer]. Axial non-contrast 3 mm slices were carried out through the heart. The data set was analyzed on a dedicated work station and scored using the Agatston method. FINDINGS: Non-cardiac: See separate report from [facility]. Motion artifact is present. Ascending Aorta: 36 mm at the mid ascending aorta measured in an axial plane. Pericardium: Normal Coronary arteries: Coronary calcium score of 118. This was 36th percentile for age and sex matched control. Calcium seen in the proximal and mid LAD proximal L circumflex. IMPRESSION: Coronary calcium score of 118. This was 36th percentile for age and sex matched control. Electronically Signed   By: Cherlynn Kaiser   On: 12/24/2020 12:17   Result Date: 12/24/2020 EXAM: OVER-READ INTERPRETATION  CT CHEST The following report is an over-read performed by radiologist Dr. Vinnie Langton of [facility], [place] on 12/24/2020. This over-read does not include interpretation of cardiac or coronary anatomy or pathology. The coronary calcium score interpretation by the cardiologist is attached. COMPARISON:  None. FINDINGS: Within the visualized portions of the thorax there are no suspicious appearing pulmonary nodules or masses, there is no acute consolidative airspace disease, no pleural  effusions, no pneumothorax and no lymphadenopathy. Visualized portions of the upper abdomen are unremarkable. There are no aggressive appearing lytic or blastic lesions noted in the visualized portions of the skeleton. IMPRESSION: 1. No significant incidental noncardiac findings are noted. Electronically Signed: By: Vinnie Langton M.D. On: 12/24/2020 08:51    Assessment & Plan:   Problem List Items Addressed This Visit     Coronary atherosclerosis    Cont on aspirin and Crestor      Dyslipidemia    Cont w/Crestor      Plantar fasciitis of right foot    Better w/Spenco, OOFOS  Other Visit Diagnoses     Needs flu shot    -  Primary   Relevant Orders   Flu Vaccine QUAD High Dose(Fluad) (Completed)         Follow-up: Return in about 6 months (around 03/02/2022) for a follow-up visit.  Walker Kehr, MD

## 2021-09-01 NOTE — Addendum Note (Signed)
Addended by: Boris Lown B on: 09/01/2021 09:17 AM   Modules accepted: Orders

## 2021-09-01 NOTE — Assessment & Plan Note (Signed)
Cont on aspirin and Crestor 

## 2021-09-01 NOTE — Assessment & Plan Note (Signed)
Better w/Spenco, OOFOS

## 2021-09-01 NOTE — Assessment & Plan Note (Signed)
Cont w/Crestor 

## 2021-09-01 NOTE — Progress Notes (Signed)
Shawn Orozco Phone: [phone] Subjective:   Fontaine No, am serving as a scribe for Dr. Hulan Saas. This visit occurred during the SARS-CoV-2 public health emergency.  Safety protocols were in place, including screening questions prior to the visit, additional usage of staff PPE, and extensive cleaning of exam room while observing appropriate contact time as indicated for disinfecting solutions.   I'm seeing this patient by the request  of:  Plotnikov, Evie Lacks, MD  CC: Right foot pain  [id number]  07/23/2021 Plan fasciitis.  Discussed with patient about icing regimen and home exercises.  Discussed with patient about inside avoiding being barefoot.  Increase activity slowly.  Discussed icing regimen.  Follow-up with me again 6 weeks.  Worsening pain will consider formal physical therapy or even potentially custom orthotics.  Update 09/02/2021 Shawn Orozco is a 75 y.o. male coming in with complaint of R foot pain. Patient states that his pain has improved. Does still have pain if he is on his feet a lot. Pain dissipates quickly. Using sandals in the house and inserts in shoes.  Patient does state he is feeling 60 to 70% better.       Past Medical History:  Diagnosis Date   Arthritis    Cataract    removed bilat 2014 with lens implants    Colon polyps    Deviated septum    GERD (gastroesophageal reflux disease)    Hyperlipidemia    preventative for FH    Prostate cancer ([place]) 2021   04-14-2020 prostate surgery    Past Surgical History:  Procedure Laterality Date   CATARACT EXTRACTION, BILATERAL     COLONOSCOPY     KNEE ARTHROSCOPY Right    NASAL SEPTUM SURGERY     POLYPECTOMY     PROSTATECTOMY  04/14/2020   SHOULDER ARTHROSCOPY Right    TONSILLECTOMY     Social History   Socioeconomic History   Marital status: Divorced    Spouse name: Not on file   Number of children: 0   Years of  education: Not on file   Highest education level: Not on file  Occupational History   Occupation: retired  Tobacco Use   Smoking status: Never   Smokeless tobacco: Never  Vaping Use   Vaping Use: Never used  Substance and Sexual Activity   Alcohol use: Never   Drug use: Never   Sexual activity: Not Currently  Other Topics Concern   Not on file  Social History Narrative   Not on file   Social Determinants of Health   Financial Resource Strain: Low Risk    Difficulty of Paying Living Expenses: Not hard at all  Food Insecurity: No Food Insecurity   Worried About Charity fundraiser in the Last Year: Never true   [place] in the Last Year: Never true  Transportation Needs: No Transportation Needs   Lack of Transportation (Medical): No   Lack of Transportation (Non-Medical): No  Physical Activity: Inactive   Days of Exercise per Week: 0 days   Minutes of Exercise per Session: 0 min  Stress: No Stress Concern Present   Feeling of Stress : Not at all  Social Connections: Moderately Integrated   Frequency of Communication with Friends and Family: More than three times a week   Frequency of Social Gatherings with Friends and Family: More than three times a week   Attends Religious Services: More than 4  times per year   Active Member of Clubs or Organizations: Yes   Attends Music therapist: More than 4 times per year   Marital Status: Never married   No Known Allergies Family History  Problem Relation Age of Onset   Alzheimer's disease Mother    Alzheimer's disease Brother    Heart disease Brother    Heart disease Brother    Colon cancer Neg Hx    Colon polyps Neg Hx    Esophageal cancer Neg Hx    Rectal cancer Neg Hx    Stomach cancer Neg Hx      Current Outpatient Medications (Cardiovascular):    rosuvastatin (CRESTOR) 10 MG tablet, Take 1 tablet (10 mg total) by mouth daily.  Current Outpatient Medications (Respiratory):    Hypertonic Nasal  Wash (SINUS RINSE) PACK, Place into the nose daily.  Current Outpatient Medications (Analgesics):    aspirin EC 81 MG tablet, Take 1 tablet (81 mg total) by mouth daily.   Current Outpatient Medications (Other):    Cholecalciferol (VITAMIN D3) 50 MCG (2000 UT) capsule, Take 1 capsule (2,000 Units total) by mouth daily.   ketoconazole (NIZORAL) 2 % cream, APPLY A SMALL AMOUNT TOPICALLY BETWEEN THE TOES AND TO AFFECTED AREA(S) TWO TIMES A DAY AS NEEDED   Niacinamide 500 MG TBCR, Take 500 mg by mouth daily.   omeprazole (PRILOSEC) 20 MG capsule, Take 1 capsule (20 mg total) by mouth daily.     Objective  Blood pressure 130/72, pulse 63, height 6' (1.829 m), weight 196 lb (88.9 kg), SpO2 96 %.   General: No apparent distress alert and oriented x3 mood and affect normal, dressed appropriately.  HEENT: Pupils equal, extraocular movements intact  Respiratory: Patient's speak in full sentences and does not appear short of breath  Cardiovascular: No lower extremity edema, non tender, no erythema  Gait still minorly antalgic Patient's foot exam does show the patient does have a fairly high arch noted.  Still tender to palpation of the medial calcaneal area.  Limited muscular skeletal ultrasound was performed and interpreted by Hulan Saas, M  Limited musculoskeletal ultrasound shows the patient still has thickening of the plantar fascia at the origin of the calcaneal area.  No tearing noted.  No hypoechoic changes otherwise.  No cortical defect noted of the calcaneal area but patient does have what appears to be some mild contusion in the area. Impression: Interval improvement of plantar fasciitis but still remaining.   Impression and Recommendations:     The above documentation has been reviewed and is accurate and complete Shawn Pulley, DO

## 2021-09-03 ENCOUNTER — Encounter: Payer: Self-pay | Admitting: Family Medicine

## 2021-09-03 ENCOUNTER — Ambulatory Visit: Payer: Self-pay

## 2021-09-03 ENCOUNTER — Other Ambulatory Visit: Payer: Self-pay

## 2021-09-03 ENCOUNTER — Ambulatory Visit (INDEPENDENT_AMBULATORY_CARE_PROVIDER_SITE_OTHER): Payer: Medicare Other | Admitting: Family Medicine

## 2021-09-03 VITALS — BP 130/72 | HR 63 | Ht 72.0 in | Wt 196.0 lb

## 2021-09-03 DIAGNOSIS — M79671 Pain in right foot: Secondary | ICD-10-CM

## 2021-09-03 DIAGNOSIS — M722 Plantar fascial fibromatosis: Secondary | ICD-10-CM

## 2021-09-03 NOTE — Assessment & Plan Note (Signed)
Patient is making significant improvement at the time.  We discussed the possibility of the further imaging, custom orthotics or formal physical therapy.  With patient improving now patient wants to continue with conservative therapy.  Follow-up again in 2 months

## 2021-09-03 NOTE — Patient Instructions (Signed)
Foots looking better still some room to go Try stretching Get another pair of inserts See you again in 2 months if needed

## 2021-11-09 ENCOUNTER — Other Ambulatory Visit: Payer: Self-pay | Admitting: *Deleted

## 2021-11-09 MED ORDER — OMEPRAZOLE 20 MG PO CPDR
20.0000 mg | DELAYED_RELEASE_CAPSULE | Freq: Every day | ORAL | 0 refills | Status: DC
Start: 2021-11-09 — End: 2022-03-03

## 2021-11-11 NOTE — Progress Notes (Signed)
[facility] Phone: [phone] Subjective:   Fontaine No, am serving as a scribe for Dr. Hulan Saas.  This visit occurred during the SARS-CoV-2 public health emergency.  Safety protocols were in place, including screening questions prior to the visit, additional usage of staff PPE, and extensive cleaning of exam room while observing appropriate contact time as indicated for disinfecting solutions.  I'm seeing this patient by the request  of:  Plotnikov, Evie Lacks, MD  CC: Right foot pain follow-up  [id number]  09/03/2021 Patient is making significant improvement at the time.  We discussed the possibility of the further imaging, custom orthotics or formal physical therapy.  With patient improving now patient wants to continue with conservative therapy.  Follow-up again in 2 months  Update 11/12/2021 Talbot Monarch is a 75 y.o. male coming in with complaint of R foot pain. Patient states that he has not had any issues in the past 3-4 weeks. Occasional burning in heel. Wearing inserts and sandals.  States he is feeling 90 to 95% better.  Has a couple times been able to walk without shoes on with no significant difficulty.  No pain in the mornings.      Past Medical History:  Diagnosis Date   Arthritis    Cataract    removed bilat 2014 with lens implants    Colon polyps    Deviated septum    GERD (gastroesophageal reflux disease)    Hyperlipidemia    preventative for FH    Prostate cancer ([place]) 2021   04-14-2020 prostate surgery    Past Surgical History:  Procedure Laterality Date   CATARACT EXTRACTION, BILATERAL     COLONOSCOPY     KNEE ARTHROSCOPY Right    NASAL SEPTUM SURGERY     POLYPECTOMY     PROSTATECTOMY  04/14/2020   SHOULDER ARTHROSCOPY Right    TONSILLECTOMY     Social History   Socioeconomic History   Marital status: Divorced    Spouse name: Not on file   Number of children: 0    Years of education: Not on file   Highest education level: Not on file  Occupational History   Occupation: retired  Tobacco Use   Smoking status: Never   Smokeless tobacco: Never  Vaping Use   Vaping Use: Never used  Substance and Sexual Activity   Alcohol use: Never   Drug use: Never   Sexual activity: Not Currently  Other Topics Concern   Not on file  Social History Narrative   Not on file   Social Determinants of Health   Financial Resource Strain: Low Risk    Difficulty of Paying Living Expenses: Not hard at all  Food Insecurity: No Food Insecurity   Worried About Charity fundraiser in the Last Year: Never true   [place] in the Last Year: Never true  Transportation Needs: No Transportation Needs   Lack of Transportation (Medical): No   Lack of Transportation (Non-Medical): No  Physical Activity: Inactive   Days of Exercise per Week: 0 days   Minutes of Exercise per Session: 0 min  Stress: No Stress Concern Present   Feeling of Stress : Not at all  Social Connections: Moderately Integrated   Frequency of Communication with Friends and Family: More than three times a week   Frequency of Social Gatherings with Friends and Family: More than three times a week   Attends Religious Services:  More than 4 times per year   Active Member of Clubs or Organizations: Yes   Attends Archivist Meetings: More than 4 times per year   Marital Status: Never married   No Known Allergies Family History  Problem Relation Age of Onset   Alzheimer's disease Mother    Alzheimer's disease Brother    Heart disease Brother    Heart disease Brother    Colon cancer Neg Hx    Colon polyps Neg Hx    Esophageal cancer Neg Hx    Rectal cancer Neg Hx    Stomach cancer Neg Hx      Current Outpatient Medications (Cardiovascular):    rosuvastatin (CRESTOR) 10 MG tablet, Take 1 tablet (10 mg total) by mouth daily.  Current Outpatient Medications (Respiratory):    Hypertonic  Nasal Wash (SINUS RINSE) PACK, Place into the nose daily.  Current Outpatient Medications (Analgesics):    aspirin EC 81 MG tablet, Take 1 tablet (81 mg total) by mouth daily.   Current Outpatient Medications (Other):    Cholecalciferol (VITAMIN D3) 50 MCG (2000 UT) capsule, Take 1 capsule (2,000 Units total) by mouth daily.   ketoconazole (NIZORAL) 2 % cream, APPLY A SMALL AMOUNT TOPICALLY BETWEEN THE TOES AND TO AFFECTED AREA(S) TWO TIMES A DAY AS NEEDED   Niacinamide 500 MG TBCR, Take 500 mg by mouth daily.   omeprazole (PRILOSEC) 20 MG capsule, Take 1 capsule (20 mg total) by mouth daily.   Reviewed prior external information including notes and imaging from  primary care provider As well as notes that were available from care everywhere and other healthcare systems.  Past medical history, social, surgical and family history all reviewed in electronic medical record.  No pertanent information unless stated regarding to the chief complaint.   Review of Systems:  No headache, visual changes, nausea, vomiting, diarrhea, constipation, dizziness, abdominal pain, skin rash, fevers, chills, night sweats, weight loss, swollen lymph nodes, body aches, joint swelling, chest pain, shortness of breath, mood changes. POSITIVE muscle aches  Objective  Blood pressure 116/74, pulse 71, height 6' (1.829 m), weight 193 lb (87.5 kg), SpO2 99 %.   General: No apparent distress alert and oriented x3 mood and affect normal, dressed appropriately.  HEENT: Pupils equal, extraocular movements intact  Respiratory: Patient's speak in full sentences and does not appear short of breath  Cardiovascular: No lower extremity edema, non tender, no erythema  Gait normal with good balance and coordination.  MSK: Right foot exam shows that patient has very minimal discomfort at the origin of the plantar fascia at the calcaneal area.  Full range of motion of the foot noted otherwise.  Neurovascular intact  distally.  Limited muscular skeletal ultrasound was performed and interpreted by Hulan Saas, M  Limited ultrasound of patient's right heel appears to be unremarkable with just mild hypoechoic changes chest superficial to the plantar fascia itself.  No tearing noted. Impression: Interval improvement    Impression and Recommendations:     The above documentation has been reviewed and is accurate and complete Lyndal Pulley, DO

## 2021-11-12 ENCOUNTER — Ambulatory Visit: Payer: Medicare Other

## 2021-11-12 ENCOUNTER — Ambulatory Visit (INDEPENDENT_AMBULATORY_CARE_PROVIDER_SITE_OTHER): Payer: Medicare Other | Admitting: Family Medicine

## 2021-11-12 ENCOUNTER — Other Ambulatory Visit: Payer: Self-pay

## 2021-11-12 ENCOUNTER — Encounter: Payer: Self-pay | Admitting: Family Medicine

## 2021-11-12 VITALS — BP 116/74 | HR 71 | Ht 72.0 in | Wt 193.0 lb

## 2021-11-12 DIAGNOSIS — M722 Plantar fascial fibromatosis: Secondary | ICD-10-CM | POA: Diagnosis not present

## 2021-11-12 DIAGNOSIS — M79671 Pain in right foot: Secondary | ICD-10-CM

## 2021-11-12 NOTE — Assessment & Plan Note (Signed)
Patient is doing significantly better at this time.  Mild change in management.  Can follow-up as needed.

## 2022-02-01 ENCOUNTER — Encounter: Payer: Self-pay | Admitting: Emergency Medicine

## 2022-02-01 ENCOUNTER — Other Ambulatory Visit: Payer: Self-pay

## 2022-02-01 ENCOUNTER — Ambulatory Visit (INDEPENDENT_AMBULATORY_CARE_PROVIDER_SITE_OTHER): Payer: Medicare Other

## 2022-02-01 ENCOUNTER — Ambulatory Visit (INDEPENDENT_AMBULATORY_CARE_PROVIDER_SITE_OTHER): Payer: Medicare Other | Admitting: Emergency Medicine

## 2022-02-01 VITALS — BP 118/60 | HR 90 | Temp 97.8°F | Ht 72.0 in | Wt 195.0 lb

## 2022-02-01 DIAGNOSIS — R062 Wheezing: Secondary | ICD-10-CM

## 2022-02-01 DIAGNOSIS — J22 Unspecified acute lower respiratory infection: Secondary | ICD-10-CM

## 2022-02-01 DIAGNOSIS — R051 Acute cough: Secondary | ICD-10-CM

## 2022-02-01 MED ORDER — HYDROCODONE BIT-HOMATROP MBR 5-1.5 MG/5ML PO SOLN: 5.0000 mL | Freq: Every evening | ORAL | 0 refills | Status: DC | PRN

## 2022-02-01 MED ORDER — ALBUTEROL SULFATE HFA 108 (90 BASE) MCG/ACT IN AERS: 2.0000 | INHALATION_SPRAY | Freq: Two times a day (BID) | RESPIRATORY_TRACT | 3 refills | Status: DC

## 2022-02-01 MED ORDER — BENZONATATE 200 MG PO CAPS: 200.0000 mg | ORAL_CAPSULE | Freq: Two times a day (BID) | ORAL | 0 refills | Status: DC | PRN

## 2022-02-01 MED ORDER — AZITHROMYCIN 250 MG PO TABS: ORAL_TABLET | ORAL | 0 refills | Status: DC

## 2022-02-01 NOTE — Patient Instructions (Signed)
Acute Bronchitis, Adult ?Acute bronchitis is when air tubes in the lungs (bronchi) suddenly get swollen. The condition can make it hard for you to breathe. In adults, acute bronchitis usually goes away within 2 weeks. A cough caused by bronchitis may last up to 3 weeks. Smoking, allergies, and asthma can make the condition worse. ?What are the causes? ?Germs that cause cold and flu (viruses). The most common cause of this condition is the virus that causes the common cold. ?Bacteria. ?Substances that bother (irritate) the lungs, including: ?Smoke from cigarettes and other types of tobacco. ?Dust and pollen. ?Fumes from chemicals, gases, or burned fuel. ?Indoor or outdoor air pollution. ?What increases the risk? ?A weak body's defense system. This is also called the immune system. ?Any condition that affects your lungs and breathing, such as asthma. ?What are the signs or symptoms? ?A cough. ?Coughing up clear, yellow, or green mucus. ?Making high-pitched whistling sounds when you breathe, most often when you breathe out (wheezing). ?Runny or stuffy nose. ?Having too much mucus in your lungs (chest congestion). ?Shortness of breath. ?Body aches. ?A sore throat. ?How is this treated? ?Acute bronchitis may go away over time without treatment. Your doctor may tell you to: ?Drink more fluids. This will help thin your mucus so it is easier to cough up. ?Use a device that gets medicine into your lungs (inhaler). ?Use a vaporizer or a humidifier. These are machines that add water to the air. This helps with coughing and poor breathing. ?Take a medicine that thins mucus and helps clear it from your lungs. ?Take a medicine that prevents or stops coughing. ?It is not common to take an antibiotic medicine for this condition. ?Follow these instructions at home: ? ?Take over-the-counter and prescription medicines only as told by your doctor. ?Use an inhaler, vaporizer, or humidifier as told by your doctor. ?Take two teaspoons (10  mL) of honey at bedtime. This helps lessen your coughing at night. ?Drink enough fluid to keep your pee (urine) pale yellow. ?Do not smoke or use any products that contain nicotine or tobacco. If you need help quitting, ask your doctor. ?Get a lot of rest. ?Return to your normal activities when your doctor says that it is safe. ?Keep all follow-up visits. ?How is this prevented? ? ?Wash your hands often with soap and water for at least 20 seconds. If you cannot use soap and water, use hand sanitizer. ?Avoid contact with people who have cold symptoms. ?Try not to touch your mouth, nose, or eyes with your hands. ?Avoid breathing in smoke or chemical fumes. ?Make sure to get the flu shot every year. ?Contact a doctor if: ?Your symptoms do not get better in 2 weeks. ?You have trouble coughing up the mucus. ?Your cough keeps you awake at night. ?You have a fever. ?Get help right away if: ?You cough up blood. ?You have chest pain. ?You have very bad shortness of breath. ?You faint or keep feeling like you are going to faint. ?You have a very bad headache. ?Your fever or chills get worse. ?These symptoms may be an emergency. Get help right away. Call your local emergency services (911 in the [place] ?Do not wait to see if the symptoms will go away. ?Do not drive yourself to the hospital. ?Summary ?Acute bronchitis is when air tubes in the lungs (bronchi) suddenly get swollen. In adults, acute bronchitis usually goes away within 2 weeks. ?Drink more fluids. This will help thin your mucus so it is easier to   cough up. ?Take over-the-counter and prescription medicines only as told by your doctor. ?Contact a doctor if your symptoms do not improve after 2 weeks of treatment. ?This information is not intended to replace advice given to you by your health care provider. Make sure you discuss any questions you have with your health care provider. ?Document Revised: 03/11/2021 Document Reviewed: 03/11/2021 ?Elsevier Patient Education  ? 2022 [employer] ? ?

## 2022-02-01 NOTE — Progress Notes (Signed)
Shawn Orozco 76 y.o.   Chief Complaint  Patient presents with   Cough    Chest congestion, x 2 weeks    HISTORY OF PRESENT ILLNESS: Acute problem visit today.  Patient of Dr. Cristie Hem Plotnikov. This is a 76 y.o. male complaining of cough and congestion for 2 weeks. Saw ENT doctor on 12/23/2021 and was started on amoxicillin and prednisone. Took amoxicillin for 10 days and prednisone for 21 days.  Did have side effects from prednisone including increased urination. Was fine for about a week and then chest symptoms started.  Mostly complaining of persisting cough productive of thick and clear sputum.  Non-smoker.  No history of asthma or COPD.  Denies chest pain.  No significant difficulty breathing. Denies fever or chills.  No other associated symptoms. No other complaints or medical concerns today.  Cough Associated symptoms include wheezing. Pertinent negatives include no chest pain, chills, fever or rash.    Prior to Admission medications   Medication Sig Start Date End Date Taking? Authorizing Provider  albuterol (VENTOLIN HFA) 108 (90 Base) MCG/ACT inhaler Inhale 2 puffs into the lungs 2 (two) times daily for 5 days. 02/01/22 02/06/22 Yes Lailie Smead, Ines Bloomer, MD  azithromycin [facility]) 250 MG tablet Sig as indicated 02/01/22  Yes Hagan Vanauken, Ines Bloomer, MD  benzonatate (TESSALON) 200 MG capsule Take 1 capsule (200 mg total) by mouth 2 (two) times daily as needed for cough. 02/01/22  Yes Junaid Wurzer, Ines Bloomer, MD  Cholecalciferol (VITAMIN D3) 50 MCG (2000 UT) capsule Take 1 capsule (2,000 Units total) by mouth daily. 12/15/20  Yes Plotnikov, Evie Lacks, MD  HYDROcodone bit-homatropine (HYCODAN) 5-1.5 MG/5ML syrup Take 5 mLs by mouth at bedtime as needed for cough. 02/01/22  Yes Chinedu Agustin, Ines Bloomer, MD  Hypertonic Nasal Wash (SINUS RINSE) PACK Place into the nose daily.   Yes [provider]  ketoconazole (NIZORAL) 2 % cream APPLY A SMALL AMOUNT TOPICALLY BETWEEN THE TOES AND TO  AFFECTED AREA(S) TWO TIMES A DAY AS NEEDED 12/26/20  Yes [provider]  Niacinamide 500 MG TBCR Take 500 mg by mouth daily. 12/31/20  Yes [provider]  omeprazole (PRILOSEC) 20 MG capsule Take 1 capsule (20 mg total) by mouth daily. 11/09/21  Yes Zehr, Laban Emperor, PA-C  rosuvastatin (CRESTOR) 10 MG tablet Take 1 tablet (10 mg total) by mouth daily. 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   Dyslipidemia 12/15/2020   Urinary incontinence 12/15/2020   Erectile dysfunction 12/15/2020   Nasal polyp 12/05/2020   Gastroesophageal reflux disease 10/13/2020   History of colonic polyps 10/13/2020    Past Medical History:  Diagnosis Date   Arthritis    Cataract    removed bilat 2014 with lens implants    Colon polyps    Deviated septum    GERD (gastroesophageal reflux disease)    Hyperlipidemia    preventative for FH    Prostate cancer ([place]) 2021   04-14-2020 prostate surgery     Past Surgical History:  Procedure Laterality Date   CATARACT EXTRACTION, BILATERAL     COLONOSCOPY     KNEE ARTHROSCOPY Right    NASAL SEPTUM SURGERY     POLYPECTOMY  PROSTATECTOMY  04/14/2020   SHOULDER ARTHROSCOPY Right    TONSILLECTOMY      Social History   Socioeconomic History   Marital status: Divorced    Spouse name: Not on file   Number of children: 0   Years of education: Not on file   Highest education level: Not on file  Occupational History   Occupation: retired  Tobacco Use   Smoking status: Never   Smokeless tobacco: Never  Vaping Use   Vaping Use: Never used  Substance and Sexual Activity   Alcohol use: Never   Drug use: Never   Sexual activity: Not Currently  Other  Topics Concern   Not on file  Social History Narrative   Not on file   Social Determinants of Health   Financial Resource Strain: Low Risk    Difficulty of Paying Living Expenses: Not hard at all  Food Insecurity: No Food Insecurity   Worried About Charity fundraiser in the Last Year: Never true   [place] in the Last Year: Never true  Transportation Needs: No Transportation Needs   Lack of Transportation (Medical): No   Lack of Transportation (Non-Medical): No  Physical Activity: Inactive   Days of Exercise per Week: 0 days   Minutes of Exercise per Session: 0 min  Stress: No Stress Concern Present   Feeling of Stress : Not at all  Social Connections: Moderately Integrated   Frequency of Communication with Friends and Family: More than three times a week   Frequency of Social Gatherings with Friends and Family: More than three times a week   Attends Religious Services: More than 4 times per year   Active Member of [employer] or Organizations: Yes   Attends Music therapist: More than 4 times per year   Marital Status: Never married  Human resources officer Violence: Not At Risk   Fear of Current or Ex-Partner: No   Emotionally Abused: No   Physically Abused: No   Sexually Abused: No    Family History  Problem Relation Age of Onset   Alzheimer's disease Mother    Alzheimer's disease Brother    Heart disease Brother    Heart disease Brother    Colon cancer Neg Hx    Colon polyps Neg Hx    Esophageal cancer Neg Hx    Rectal cancer Neg Hx    Stomach cancer Neg Hx      Review of Systems  Constitutional: Negative.  Negative for chills and fever.  HENT:  Positive for congestion.   Respiratory:  Positive for cough, sputum production and wheezing.   Cardiovascular: Negative.  Negative for chest pain and palpitations.  Gastrointestinal: Negative.  Negative for abdominal pain, diarrhea, nausea and vomiting.  Genitourinary: Negative.  Negative for dysuria and  hematuria.  Skin: Negative.  Negative for rash.  All other systems reviewed and are negative.  Today's Vitals   02/01/22 0928  BP: 118/60  Pulse: 90  Temp: 97.8 F (36.6 C)  TempSrc: Oral  SpO2: 97%  Weight: 195 lb (88.5 kg)  Height: 6' (1.829 m)   Body mass index is 26.45 kg/m.  Physical Exam Vitals reviewed.  Constitutional:      Appearance: Normal appearance.  HENT:     Head: Normocephalic.  Eyes:     Extraocular Movements: Extraocular movements intact.     Conjunctiva/sclera: Conjunctivae normal.     Pupils: Pupils are equal, round, and reactive to light.  Cardiovascular:  Rate and Rhythm: Normal rate and regular rhythm.     Pulses: Normal pulses.     Heart sounds: Normal heart sounds.  Pulmonary:     Effort: Pulmonary effort is normal.     Breath sounds: Wheezing and rhonchi present.  Musculoskeletal:     Cervical back: No tenderness.     Right lower leg: No edema.     Left lower leg: No edema.  Lymphadenopathy:     Cervical: No cervical adenopathy.  Skin:    General: Skin is warm and dry.     Capillary Refill: Capillary refill takes less than 2 seconds.  Neurological:     General: No focal deficit present.     Mental Status: He is alert and oriented to person, place, and time.  Psychiatric:        Mood and Affect: Mood normal.        Behavior: Behavior normal.   DG Chest 2 View  Result Date: 02/01/2022 CLINICAL DATA:  Cough EXAM: CHEST - 2 VIEW COMPARISON:  None. FINDINGS: Heart size and mediastinal contours are within normal limits. No suspicious pulmonary opacities identified. No pleural effusion or pneumothorax visualized. No acute osseous abnormality appreciated. IMPRESSION: No acute intrathoracic process identified. Electronically Signed   By: Ofilia Neas M.D.   On: 02/01/2022 10:17    ASSESSMENT & PLAN: Clinically stable.  No red flag signs or symptoms. Clinical picture compatible with acute bronchitis.  No pneumonia on chest x-ray. May  benefit from antibiotic.  We will start azithromycin for 5 days. Bronchospasm on physical examination.  Will benefit from albuterol twice a day for 5 days. At this point we will stay away from corticosteroids. Cough control with over-the-counter Mucinex DM and prescription Tessalon with Hycodan syrup as needed. Advised to contact the office if no better or worse during the next several days. ED precautions given.  Problem List Items Addressed This Visit   None Visit Diagnoses     Lower respiratory infection    -  Primary   Relevant Medications   azithromycin (ZITHROMAX) 250 MG tablet   Other Relevant Orders   DG Chest 2 View (Completed)   Acute cough       Relevant Medications   benzonatate (TESSALON) 200 MG capsule   HYDROcodone bit-homatropine (HYCODAN) 5-1.5 MG/5ML syrup   Wheezing       Relevant Medications   albuterol (VENTOLIN HFA) 108 (90 Base) MCG/ACT inhaler      Patient Instructions  Acute Bronchitis, Adult Acute bronchitis is when air tubes in the lungs (bronchi) suddenly get swollen. The condition can make it hard for you to breathe. In adults, acute bronchitis usually goes away within 2 weeks. A cough caused by bronchitis may last up to 3 weeks. Smoking, allergies, and asthma can make the condition worse. What are the causes? Germs that cause cold and flu (viruses). The most common cause of this condition is the virus that causes the common cold. Bacteria. Substances that bother (irritate) the lungs, including: Smoke from cigarettes and other types of tobacco. Dust and pollen. Fumes from chemicals, gases, or burned fuel. Indoor or outdoor air pollution. What increases the risk? A weak body's defense system. This is also called the immune system. Any condition that affects your lungs and breathing, such as asthma. What are the signs or symptoms? A cough. Coughing up clear, yellow, or green mucus. Making high-pitched whistling sounds when you breathe, most often  when you breathe out (wheezing). Runny or stuffy nose.  Having too much mucus in your lungs (chest congestion). Shortness of breath. Body aches. A sore throat. How is this treated? Acute bronchitis may go away over time without treatment. Your doctor may tell you to: Drink more fluids. This will help thin your mucus so it is easier to cough up. Use a device that gets medicine into your lungs (inhaler). Use a vaporizer or a humidifier. These are machines that add water to the air. This helps with coughing and poor breathing. Take a medicine that thins mucus and helps clear it from your lungs. Take a medicine that prevents or stops coughing. It is not common to take an antibiotic medicine for this condition. Follow these instructions at home:  Take over-the-counter and prescription medicines only as told by your doctor. Use an inhaler, vaporizer, or humidifier as told by your doctor. Take two teaspoons (10 mL) of honey at bedtime. This helps lessen your coughing at night. Drink enough fluid to keep your pee (urine) pale yellow. Do not smoke or use any products that contain nicotine or tobacco. If you need help quitting, ask your doctor. Get a lot of rest. Return to your normal activities when your doctor says that it is safe. Keep all follow-up visits. How is this prevented?  Wash your hands often with soap and water for at least 20 seconds. If you cannot use soap and water, use hand sanitizer. Avoid contact with people who have cold symptoms. Try not to touch your mouth, nose, or eyes with your hands. Avoid breathing in smoke or chemical fumes. Make sure to get the flu shot every year. Contact a doctor if: Your symptoms do not get better in 2 weeks. You have trouble coughing up the mucus. Your cough keeps you awake at night. You have a fever. Get help right away if: You cough up blood. You have chest pain. You have very bad shortness of breath. You faint or keep feeling like you  are going to faint. You have a very bad headache. Your fever or chills get worse. These symptoms may be an emergency. Get help right away. Call your local emergency services (911 in the [place] Do not wait to see if the symptoms will go away. Do not drive yourself to the hospital. Summary Acute bronchitis is when air tubes in the lungs (bronchi) suddenly get swollen. In adults, acute bronchitis usually goes away within 2 weeks. Drink more fluids. This will help thin your mucus so it is easier to cough up. Take over-the-counter and prescription medicines only as told by your doctor. Contact a doctor if your symptoms do not improve after 2 weeks of treatment. This information is not intended to replace advice given to you by your health care provider. Make sure you discuss any questions you have with your health care provider. Document Revised: 03/11/2021 Document Reviewed: 03/11/2021 Elsevier Patient Education  2022 [place], MD [facility]

## 2022-03-03 ENCOUNTER — Ambulatory Visit (INDEPENDENT_AMBULATORY_CARE_PROVIDER_SITE_OTHER): Payer: Medicare Other | Admitting: Internal Medicine

## 2022-03-03 ENCOUNTER — Telehealth: Payer: Self-pay | Admitting: Gastroenterology

## 2022-03-03 ENCOUNTER — Encounter: Payer: Self-pay | Admitting: Internal Medicine

## 2022-03-03 VITALS — BP 120/70 | HR 57 | Temp 97.6°F | Ht 72.0 in | Wt 195.0 lb

## 2022-03-03 DIAGNOSIS — J301 Allergic rhinitis due to pollen: Secondary | ICD-10-CM

## 2022-03-03 DIAGNOSIS — C61 Malignant neoplasm of prostate: Secondary | ICD-10-CM

## 2022-03-03 DIAGNOSIS — J309 Allergic rhinitis, unspecified: Secondary | ICD-10-CM | POA: Insufficient documentation

## 2022-03-03 DIAGNOSIS — E785 Hyperlipidemia, unspecified: Secondary | ICD-10-CM | POA: Diagnosis not present

## 2022-03-03 DIAGNOSIS — J339 Nasal polyp, unspecified: Secondary | ICD-10-CM

## 2022-03-03 DIAGNOSIS — Z0000 Encounter for general adult medical examination without abnormal findings: Secondary | ICD-10-CM

## 2022-03-03 LAB — COMPREHENSIVE METABOLIC PANEL
ALT: 18 U/L (ref 0–53)
AST: 19 U/L (ref 0–37)
Albumin: 4.3 g/dL (ref 3.5–5.2)
Alkaline Phosphatase: 67 U/L (ref 39–117)
BUN: 12 mg/dL (ref 6–23)
CO2: 28 mEq/L (ref 19–32)
Calcium: 9.2 mg/dL (ref 8.4–10.5)
Chloride: 104 mEq/L (ref 96–112)
Creatinine, Ser: 1.18 mg/dL (ref 0.40–1.50)
GFR: 60.02 mL/min (ref >60.00)
Glucose, Bld: 122 mg/dL — ABNORMAL HIGH (ref 70–99)
Potassium: 4 mEq/L (ref 3.5–5.1)
Sodium: 140 mEq/L (ref 135–145)
Total Bilirubin: 2.5 mg/dL — ABNORMAL HIGH (ref 0.2–1.2)
Total Protein: 6.8 g/dL (ref 6.0–8.3)

## 2022-03-03 LAB — URINALYSIS
Bilirubin Urine: NEGATIVE
Hgb urine dipstick: NEGATIVE
Ketones, ur: NEGATIVE
Leukocytes,Ua: NEGATIVE
Nitrite: NEGATIVE
Specific Gravity, Urine: 1.025 (ref 1.000–1.030)
Total Protein, Urine: NEGATIVE
Urine Glucose: NEGATIVE
Urobilinogen, UA: 0.2 (ref 0.0–1.0)
pH: 5.5 (ref 5.0–8.0)

## 2022-03-03 LAB — LIPID PANEL
Cholesterol: 140 mg/dL (ref 0–200)
HDL: 47 mg/dL (ref >39.00)
LDL Cholesterol: 67 mg/dL (ref 0–99)
NonHDL: 93.05
Total CHOL/HDL Ratio: 3
Triglycerides: 132 mg/dL (ref 0.0–149.0)
VLDL: 26.4 mg/dL (ref 0.0–40.0)

## 2022-03-03 LAB — CBC WITH DIFFERENTIAL/PLATELET
Basophils Absolute: 0 10*3/uL (ref 0.0–0.1)
Basophils Relative: 0.6 % (ref 0.0–3.0)
Eosinophils Absolute: 0.2 10*3/uL (ref 0.0–0.7)
Eosinophils Relative: 2.6 % (ref 0.0–5.0)
HCT: 44.7 % (ref 39.0–52.0)
Hemoglobin: 15.2 g/dL (ref 13.0–17.0)
Lymphocytes Relative: 32 % (ref 12.0–46.0)
Lymphs Abs: 2.2 10*3/uL (ref 0.7–4.0)
MCHC: 34.1 g/dL (ref 30.0–36.0)
MCV: 96.7 fl (ref 78.0–100.0)
Monocytes Absolute: 0.5 10*3/uL (ref 0.1–1.0)
Monocytes Relative: 7.8 % (ref 3.0–12.0)
Neutro Abs: 3.9 10*3/uL (ref 1.4–7.7)
Neutrophils Relative %: 57 % (ref 43.0–77.0)
Platelets: 195 10*3/uL (ref 150.0–400.0)
RBC: 4.62 Mil/uL (ref 4.22–5.81)
RDW: 14.1 % (ref 11.5–15.5)
WBC: 6.9 10*3/uL (ref 4.0–10.5)

## 2022-03-03 LAB — TSH: TSH: 2.3 u[IU]/mL (ref 0.35–5.50)

## 2022-03-03 MED ORDER — OMEPRAZOLE 20 MG PO CPDR: 20.0000 mg | DELAYED_RELEASE_CAPSULE | Freq: Every day | ORAL | 0 refills | Status: DC

## 2022-03-03 MED ORDER — LORATADINE 10 MG PO TABS: 10.0000 mg | ORAL_TABLET | Freq: Every day | ORAL | 11 refills | Status: AC

## 2022-03-03 MED ORDER — ASPIRIN EC 81 MG PO TBEC: 81.0000 mg | DELAYED_RELEASE_TABLET | Freq: Every day | ORAL | 3 refills | Status: AC

## 2022-03-03 NOTE — Progress Notes (Signed)
? ?Subjective:  ?Patient ID: Shawn Orozco, male    DOB: 09-20-1946  Age: 76 y.o. MRN: [id number] ? ?CC: No chief complaint on file. ? ? ?HPI ?Azarian Starace presents for recent URI, allergies, prostate cancer, dyslipidemia. PSA was up a little -- 0.03. ? ?Outpatient Medications Prior to Visit  ?Medication Sig Dispense Refill  ? benzonatate (TESSALON) 200 MG capsule Take 1 capsule (200 mg total) by mouth 2 (two) times daily as needed for cough. 20 capsule 0  ? Cholecalciferol (VITAMIN D3) 50 MCG (2000 UT) capsule Take 1 capsule (2,000 Units total) by mouth daily. 100 capsule 3  ? Hypertonic Nasal Wash (SINUS RINSE) PACK Place into the nose daily.    ? ketoconazole (NIZORAL) 2 % cream APPLY A SMALL AMOUNT TOPICALLY BETWEEN THE TOES AND TO AFFECTED AREA(S) TWO TIMES A DAY AS NEEDED    ? Niacinamide 500 MG TBCR Take 500 mg by mouth daily.    ? omeprazole (PRILOSEC) 20 MG capsule Take 1 capsule (20 mg total) by mouth daily. 90 capsule 0  ? rosuvastatin (CRESTOR) 10 MG tablet Take 1 tablet (10 mg total) by mouth daily. 90 tablet 3  ? azithromycin (ZITHROMAX) 250 MG tablet Sig as indicated 6 tablet 0  ? HYDROcodone bit-homatropine (HYCODAN) 5-1.5 MG/5ML syrup Take 5 mLs by mouth at bedtime as needed for cough. 120 mL 0  ? albuterol (VENTOLIN HFA) 108 (90 Base) MCG/ACT inhaler Inhale 2 puffs into the lungs 2 (two) times daily for 5 days. 1 each 3  ? ?No facility-administered medications prior to visit.  ? ? ?ROS: ?Review of Systems  ?Constitutional:  Negative for appetite change, fatigue and unexpected weight change.  ?HENT:  Negative for congestion, nosebleeds, sneezing, sore throat and trouble swallowing.   ?Eyes:  Negative for itching and visual disturbance.  ?Respiratory:  Negative for cough.   ?Cardiovascular:  Negative for chest pain, palpitations and leg swelling.  ?Gastrointestinal:  Negative for abdominal distention, blood in stool, diarrhea and nausea.  ?Genitourinary:  Negative for frequency and hematuria.   ?Musculoskeletal:  Negative for back pain, gait problem, joint swelling and neck pain.  ?Skin:  Negative for rash.  ?Neurological:  Negative for dizziness, tremors, speech difficulty and weakness.  ?Psychiatric/Behavioral:  Negative for agitation, dysphoric mood and sleep disturbance. The patient is not nervous/anxious.   ? ?Objective:  ?BP 120/70 (BP Location: Left Arm, Patient Position: Sitting, Cuff Size: Large)   Pulse (!) 57   Temp 97.6 ?F (36.4 ?C) (Oral)   Ht 6' (1.829 m)   Wt 195 lb (88.5 kg)   SpO2 95%   BMI 26.45 kg/m?  ? ?BP Readings from Last 3 Encounters:  ?03/03/22 120/70  ?02/01/22 118/60  ?11/12/21 116/74  ? ? ?Wt Readings from Last 3 Encounters:  ?03/03/22 195 lb (88.5 kg)  ?02/01/22 195 lb (88.5 kg)  ?11/12/21 193 lb (87.5 kg)  ? ? ?Physical Exam ?Constitutional:   ?   General: He is not in acute distress. ?   Appearance: Normal appearance. He is well-developed.  ?   Comments: NAD  ?Eyes:  ?   Conjunctiva/sclera: Conjunctivae normal.  ?   Pupils: Pupils are equal, round, and reactive to light.  ?Neck:  ?   Thyroid: No thyromegaly.  ?   Vascular: No JVD.  ?Cardiovascular:  ?   Rate and Rhythm: Normal rate and regular rhythm.  ?   Heart sounds: Normal heart sounds. No murmur heard. ?  No friction rub. No gallop.  ?Pulmonary:  ?  Effort: Pulmonary effort is normal. No respiratory distress.  ?   Breath sounds: Normal breath sounds. No wheezing or rales.  ?Chest:  ?   Chest wall: No tenderness.  ?Abdominal:  ?   General: Bowel sounds are normal. There is no distension.  ?   Palpations: Abdomen is soft. There is no mass.  ?   Tenderness: There is no abdominal tenderness. There is no guarding or rebound.  ?Musculoskeletal:     ?   General: No tenderness. Normal range of motion.  ?   Cervical back: Normal range of motion.  ?Lymphadenopathy:  ?   Cervical: No cervical adenopathy.  ?Skin: ?   General: Skin is warm and dry.  ?   Findings: No rash.  ?Neurological:  ?   Mental Status: He is alert and  oriented to person, place, and time.  ?   Cranial Nerves: No cranial nerve deficit.  ?   Motor: No abnormal muscle tone.  ?   Coordination: Coordination normal.  ?   Gait: Gait normal.  ?   Deep Tendon Reflexes: Reflexes are normal and symmetric.  ?Psychiatric:     ?   Behavior: Behavior normal.     ?   Thought Content: Thought content normal.     ?   Judgment: Judgment normal.  ? ? ?Lab Results  ?Component Value Date  ? GLUCOSE 119 (H) 09/01/2021  ? CHOL 145 09/01/2021  ? TRIG 113.0 09/01/2021  ? HDL 46.20 09/01/2021  ? [place] 76 09/01/2021  ? ALT 15 09/01/2021  ? AST 19 09/01/2021  ? NA 140 09/01/2021  ? K 4.6 09/01/2021  ? CL 105 09/01/2021  ? CREATININE 1.17 09/01/2021  ? BUN 22 09/01/2021  ? CO2 28 09/01/2021  ? TSH 2.57 09/01/2021  ? ? ?CT CARDIAC SCORING (SELF PAY ONLY) ? ?Addendum Date: 12/24/2020   ?ADDENDUM REPORT: 12/24/2020 12:17 CLINICAL DATA:  Risk stratification, CAD screening, 31yrCHD risk < 10%, Dyslipidemia EXAM: Coronary Calcium Score TECHNIQUE: The patient was scanned on a [employer] Axial non-contrast 3 mm slices were carried out through the heart. The data set was analyzed on a dedicated work station and scored using the Agatston method. FINDINGS: Non-cardiac: [facility]. Motion artifact is present. Ascending Aorta: 36 mm at the mid ascending aorta measured in an axial plane. Pericardium: Normal Coronary arteries: Coronary calcium score of 118. This was 36th percentile for age and sex matched control. Calcium seen in the proximal and mid LAD proximal L circumflex. IMPRESSION: Coronary calcium score of 118. This was 36th percentile for age and sex matched control. Electronically Signed   By: GCherlynn Kaiser  On: 12/24/2020 12:17  ? ?Result Date: 12/24/2020 ?EXAM: OVER-READ INTERPRETATION  CT CHEST The following report is an over-read performed by radiologist Dr. DVinnie Langtonof [facility], [place] 12/24/2020. This over-read does not include  interpretation of cardiac or coronary anatomy or pathology. The coronary calcium score interpretation by the cardiologist is attached. COMPARISON:  None. FINDINGS: Within the visualized portions of the thorax there are no suspicious appearing pulmonary nodules or masses, there is no acute consolidative airspace disease, no pleural effusions, no pneumothorax and no lymphadenopathy. Visualized portions of the upper abdomen are unremarkable. There are no aggressive appearing lytic or blastic lesions noted in the visualized portions of the skeleton. IMPRESSION: 1. No significant incidental noncardiac findings are noted. Electronically Signed: By: DVinnie LangtonM.D. On: 12/24/2020 08:51  ? ? ?Assessment &  Plan:  ? ?Problem List Items Addressed This Visit   ? ? Nasal polyp - Primary  ? Relevant Orders  ? CBC with Differential/Platelet  ? Prostate cancer ([place])  ?  PSA was up a little -- 0.03. F/u w/Dr Milford Cage  ?  ?  ? Relevant Medications  ? aspirin EC 81 MG tablet  ? Dyslipidemia  ?   2022 coronary calcium CT score of 118. ?Calcium seen in the proximal and mid LAD proximal L circumflex. ?On ASA, Crestor ?  ?  ? Relevant Orders  ? TSH  ? Urinalysis  ? CBC with Differential/Platelet  ? Lipid panel  ? Comprehensive metabolic panel  ? Allergic rhinitis  ?  Worse ?Start Claritin ?  ?  ? ?Other Visit Diagnoses   ? ? Well adult exam      ? Relevant Orders  ? TSH  ? Urinalysis  ? CBC with Differential/Platelet  ? Lipid panel  ? Comprehensive metabolic panel  ? ?  ?  ? ? ?Meds ordered this encounter  ?Medications  ? aspirin EC 81 MG tablet  ?  Sig: Take 1 tablet (81 mg total) by mouth daily.  ?  Dispense:  100 tablet  ?  Refill:  3  ? loratadine (CLARITIN) 10 MG tablet  ?  Sig: Take 1 tablet (10 mg total) by mouth daily.  ?  Dispense:  30 tablet  ?  Refill:  11  ?  ? ? ?Follow-up: Return in about 6 months (around 09/02/2022) for Wellness Exam. ? ?Walker Kehr, MD ?

## 2022-03-03 NOTE — Assessment & Plan Note (Addendum)
PSA was up a little -- 0.03. F/u w/Dr Milford Cage  ?

## 2022-03-03 NOTE — Assessment & Plan Note (Signed)
2022 coronary calcium CT score of 118. ?Calcium seen in the proximal and mid LAD proximal L circumflex. ?On ASA, Crestor ?

## 2022-03-03 NOTE — Assessment & Plan Note (Signed)
Worse ?Start Claritin ?

## 2022-03-03 NOTE — Telephone Encounter (Signed)
Script sent to patient's pharmacy. Will need office visit for additional refills. ?

## 2022-03-03 NOTE — Telephone Encounter (Signed)
Patient called regarding Prilosec medication. Patient states that his pharmacy has been trying to contact us for authorization. Patient is requesting Prilosec refill. Please advise.   ?

## 2022-03-22 ENCOUNTER — Emergency Department (HOSPITAL_BASED_OUTPATIENT_CLINIC_OR_DEPARTMENT_OTHER): Payer: Medicare Other

## 2022-03-22 ENCOUNTER — Emergency Department (HOSPITAL_BASED_OUTPATIENT_CLINIC_OR_DEPARTMENT_OTHER)
Admission: EM | Admit: 2022-03-22 | Discharge: 2022-03-22 | Disposition: A | Discharge status: Home / Self Care | Payer: Medicare Other | Attending: Emergency Medicine | Admitting: Emergency Medicine

## 2022-03-22 ENCOUNTER — Encounter (HOSPITAL_BASED_OUTPATIENT_CLINIC_OR_DEPARTMENT_OTHER): Payer: Self-pay | Admitting: Emergency Medicine

## 2022-03-22 ENCOUNTER — Other Ambulatory Visit: Payer: Self-pay

## 2022-03-22 DIAGNOSIS — S022XXA Fracture of nasal bones, initial encounter for closed fracture: Secondary | ICD-10-CM | POA: Insufficient documentation

## 2022-03-22 DIAGNOSIS — Z23 Encounter for immunization: Secondary | ICD-10-CM | POA: Diagnosis not present

## 2022-03-22 DIAGNOSIS — S01111A Laceration without foreign body of right eyelid and periocular area, initial encounter: Secondary | ICD-10-CM | POA: Diagnosis not present

## 2022-03-22 DIAGNOSIS — Z8546 Personal history of malignant neoplasm of prostate: Secondary | ICD-10-CM | POA: Diagnosis not present

## 2022-03-22 DIAGNOSIS — R55 Syncope and collapse: Secondary | ICD-10-CM | POA: Diagnosis not present

## 2022-03-22 DIAGNOSIS — S0992XA Unspecified injury of nose, initial encounter: Secondary | ICD-10-CM | POA: Diagnosis present

## 2022-03-22 DIAGNOSIS — W06XXXA Fall from bed, initial encounter: Secondary | ICD-10-CM | POA: Diagnosis not present

## 2022-03-22 DIAGNOSIS — M545 Low back pain, unspecified: Secondary | ICD-10-CM | POA: Diagnosis not present

## 2022-03-22 LAB — COMPREHENSIVE METABOLIC PANEL
ALT: 19 U/L (ref 0–44)
AST: 20 U/L (ref 15–41)
Albumin: 3.9 g/dL (ref 3.5–5.0)
Alkaline Phosphatase: 65 U/L (ref 38–126)
Anion gap: 6 (ref 5–15)
BUN: 20 mg/dL (ref 8–23)
CO2: 25 mmol/L (ref 22–32)
Calcium: 9 mg/dL (ref 8.9–10.3)
Chloride: 108 mmol/L (ref 98–111)
Creatinine, Ser: 1.15 mg/dL (ref 0.61–1.24)
GFR, Estimated: >60 mL/min (ref >60)
Glucose, Bld: 151 mg/dL — ABNORMAL HIGH (ref 70–99)
Potassium: 3.7 mmol/L (ref 3.5–5.1)
Sodium: 139 mmol/L (ref 135–145)
Total Bilirubin: 2.7 mg/dL — ABNORMAL HIGH (ref 0.3–1.2)
Total Protein: 6.7 g/dL (ref 6.5–8.1)

## 2022-03-22 LAB — CBC WITH DIFFERENTIAL/PLATELET
Abs Immature Granulocytes: 0.03 10*3/uL (ref 0.00–0.07)
Basophils Absolute: 0 10*3/uL (ref 0.0–0.1)
Basophils Relative: 1 %
Eosinophils Absolute: 0.2 10*3/uL (ref 0.0–0.5)
Eosinophils Relative: 3 %
HCT: 44.2 % (ref 39.0–52.0)
Hemoglobin: 15.7 g/dL (ref 13.0–17.0)
Immature Granulocytes: 0 %
Lymphocytes Relative: 33 %
Lymphs Abs: 2.5 10*3/uL (ref 0.7–4.0)
MCH: 33.4 pg (ref 26.0–34.0)
MCHC: 35.5 g/dL (ref 30.0–36.0)
MCV: 94 fL (ref 80.0–100.0)
Monocytes Absolute: 0.6 10*3/uL (ref 0.1–1.0)
Monocytes Relative: 8 %
Neutro Abs: 4.2 10*3/uL (ref 1.7–7.7)
Neutrophils Relative %: 55 %
Platelets: 192 10*3/uL (ref 150–400)
RBC: 4.7 MIL/uL (ref 4.22–5.81)
RDW: 12.9 % (ref 11.5–15.5)
WBC: 7.6 10*3/uL (ref 4.0–10.5)
nRBC: 0 % (ref 0.0–0.2)

## 2022-03-22 LAB — URINALYSIS, MICROSCOPIC (REFLEX)

## 2022-03-22 LAB — URINALYSIS, ROUTINE W REFLEX MICROSCOPIC
Bilirubin Urine: NEGATIVE
Glucose, UA: NEGATIVE mg/dL
Ketones, ur: NEGATIVE mg/dL
Leukocytes,Ua: NEGATIVE
Nitrite: NEGATIVE
Protein, ur: NEGATIVE mg/dL
Specific Gravity, Urine: 1.025 (ref 1.005–1.030)
pH: 5.5 (ref 5.0–8.0)

## 2022-03-22 LAB — TROPONIN I (HIGH SENSITIVITY)
Troponin I (High Sensitivity): 6 ng/L (ref <18)
Troponin I (High Sensitivity): 6 ng/L (ref <18)

## 2022-03-22 MED ORDER — LIDOCAINE-EPINEPHRINE 2 %-1:200000 IJ SOLN
10.0000 mL | Freq: Once | INTRAMUSCULAR | Status: AC
Start: 2022-03-22 — End: 2022-03-22
  Administered 2022-03-22: 10 mL
  Filled 2022-03-22: qty 20

## 2022-03-22 MED ORDER — TETANUS-DIPHTH-ACELL PERTUSSIS 5-2.5-18.5 LF-MCG/0.5 IM SUSY
0.5000 mL | PREFILLED_SYRINGE | Freq: Once | INTRAMUSCULAR | Status: AC
  Administered 2022-03-22: 0.5 mL via INTRAMUSCULAR
  Filled 2022-03-22: qty 0.5

## 2022-03-22 MED ORDER — SODIUM CHLORIDE 0.9 % IV BOLUS
500.0000 mL | Freq: Once | INTRAVENOUS | Status: AC
  Administered 2022-03-22: 500 mL via INTRAVENOUS

## 2022-03-22 NOTE — ED Notes (Signed)
ED Provider at bedside. 

## 2022-03-22 NOTE — ED Notes (Signed)
Completed orthostatic vitals at 8:08 AM. Pt reported no change in status, not dizzy or short of breath. Pt back in bed safely call bed within reach ?

## 2022-03-22 NOTE — Discharge Instructions (Signed)
You were seen in the emergency room today after passing out at home.  He had a eyebrow laceration which we repaired with suture and will need to be removed in the next week.  Your primary doctor may be comfortable doing this or you can return to the emergency department for reevaluation.  ? ?If you have return of passing out you should also return to the emergency department.  ?

## 2022-03-22 NOTE — ED Notes (Signed)
Patient transported to CT 

## 2022-03-22 NOTE — ED Triage Notes (Addendum)
Syncopal episode this morning ~0630 when getting out of bed this morning. Pt hit his face when he fell. Lac to R eyebrow, and pt also reports nose injury with bleeding. He reports hx of deviated septum surgery. States his nose is somewhat crooked at baseline, he is not sure if it is different post fall. R lower back pain x ~1 week that he has been taking Advil for. States he thinks he pulled a muscle. ASA 81 mg daily, no other blood thinners. ?

## 2022-03-22 NOTE — ED Provider Notes (Signed)
? ?Emergency Department Provider Note ? ? ?I have reviewed the triage vital signs and the nursing notes. ? ? ?HISTORY ? ?Chief Complaint ?Loss of Consciousness ? ? ?HPI ?Shawn Orozco is a 76 y.o. male with past history reviewed below presents emergency department for evaluation of syncope this morning upon getting out of bed.  Patient has been in his normal state of health and woke up this morning feeling well.  He swung his legs over to the edge of the bed and went to stand up when he suddenly lost consciousness.  He assumes that he fell forward hitting his head on the floor.  He feels that he regained consciousness almost immediately and found blood on the floor.  He was able to stand up and get to the bathroom and clean up the blood.  He called family to make them aware of what happened and came to the emergency department.  He is not having pain/pressure/tightness in the chest, palpitations, shortness of breath.  He had some lower back pain which she attributed to a muscle pull over the past week but not suddenly worse or changing in quality/character.  No numbness or weakness.  Mild headache.  He notes some bleeding with laceration over the right eyebrow and some pain/swelling to the nose.  ? ? ?Past Medical History:  ?Diagnosis Date  ? Arthritis   ? Cataract   ? removed bilat 2014 with lens implants   ? Colon polyps   ? Deviated septum   ? GERD (gastroesophageal reflux disease)   ? Hyperlipidemia   ? preventative for FH   ? Prostate cancer [facility]) 2021  ? 04-14-2020 prostate surgery   ? ? ?Review of Systems ? ?Constitutional: No fever/chills ?Eyes: No visual changes. ?ENT: No sore throat. ?Cardiovascular: Denies chest pain. Positive syncope.  ?Respiratory: Denies shortness of breath. ?Gastrointestinal: No abdominal pain.  No nausea, no vomiting.  No diarrhea.  No constipation. ?Genitourinary: Negative for dysuria. ?Musculoskeletal: Positive for back pain. ?Skin: Positive right eyebrow laceration.  ?Neurological:  Negative for focal weakness or numbness. Positive for HA.  ? ? ?____________________________________________ ? ? ?PHYSICAL EXAM: ? ?VITAL SIGNS: ?ED Triage Vitals  ?Enc Vitals Group  ?   BP 03/22/22 0707 (!) 157/79  ?   Pulse Rate 03/22/22 0707 81  ?   Resp 03/22/22 0707 (!) 24  ?   Temp 03/22/22 0707 97.7 ?F (36.5 ?C)  ?   Temp Source 03/22/22 0707 Oral  ?   SpO2 03/22/22 0707 93 %  ?   Weight 03/22/22 0709 192 lb (87.1 kg)  ?   Height 03/22/22 0709 6' (1.829 m)  ? ?Constitutional: Alert and oriented. Well appearing and in no acute distress. ?Eyes: Conjunctivae are normal. PERRL. 3 cm laceration to the right eyebrow.  ?Head: Atraumatic. ?Nose: No congestion/rhinnorhea. No active bleeding or septal hematoma.  ?Mouth/Throat: Mucous membranes are moist.  Oropharynx non-erythematous. ?Neck: No stridor. No cervical spine tenderness to palpation. ?Cardiovascular: Normal rate, regular rhythm. Good peripheral circulation. Grossly normal heart sounds.   ?Respiratory: Normal respiratory effort.  No retractions. Lungs CTAB. ?Gastrointestinal: Soft and nontender. No distention.  ?Musculoskeletal: No lower extremity tenderness nor edema. No gross deformities of extremities. ?Neurologic:  Normal speech and language. No gross focal neurologic deficits are appreciated.  ?Skin:  Skin is warm, dry and intact. No rash noted. ? ?____________________________________________ ?  ?LABS ?(all labs ordered are listed, but only abnormal results are displayed) ? ?Labs Reviewed  ?COMPREHENSIVE METABOLIC PANEL - Abnormal; Notable  for the following components:  ?    Result Value  ? Glucose, Bld 151 (*)   ? Total Bilirubin 2.7 (*)   ? All other components within normal limits  ?URINALYSIS, ROUTINE W REFLEX MICROSCOPIC - Abnormal; Notable for the following components:  ? Hgb urine dipstick TRACE (*)   ? All other components within normal limits  ?URINALYSIS, MICROSCOPIC (REFLEX) - Abnormal; Notable for the following components:  ? Bacteria, UA  RARE (*)   ? All other components within normal limits  ?CBC WITH DIFFERENTIAL/PLATELET  ?TROPONIN I (HIGH SENSITIVITY)  ?TROPONIN I (HIGH SENSITIVITY)  ? ?____________________________________________ ? ?EKG ? ? EKG Interpretation ? ?Date/Time:  Monday Mar 22 2022 07:04:05 EDT ?Ventricular Rate:  76 ?PR Interval:  172 ?QRS Duration: 115 ?QT Interval:  416 ?QTC Calculation: 468 ?R Axis:   -59 ?Text Interpretation: Sinus rhythm Incomplete RBBB and LAFB No prior for comparison Confirmed by Nanda Quinton [phone]) on 03/22/2022 7:06:39 AM ?  ? ?  ? ? ?____________________________________________ ? ?RADIOLOGY ? ?CT Head Wo Contrast ? ?Result Date: 03/22/2022 ?CLINICAL DATA:  Syncopal episode getting out of bed this morning. Fall with facial laceration in the right eyebrow region. Nose bleed. EXAM: CT HEAD WITHOUT CONTRAST CT MAXILLOFACIAL WITHOUT CONTRAST TECHNIQUE: Multidetector CT imaging of the head and maxillofacial structures were performed using the standard protocol without intravenous contrast. Multiplanar CT image reconstructions of the maxillofacial structures were also generated. RADIATION DOSE REDUCTION: This exam was performed according to the departmental dose-optimization program which includes automated exposure control, adjustment of the mA and/or kV according to patient size and/or use of iterative reconstruction technique. COMPARISON:  None. FINDINGS: CT HEAD FINDINGS Brain: There is no evidence for acute hemorrhage, hydrocephalus, mass lesion, or abnormal extra-axial fluid collection. No definite CT evidence for acute infarction. Vascular: No hyperdense vessel or unexpected calcification. Skull: No evidence for fracture. No worrisome lytic or sclerotic lesion. Other: None. CT MAXILLOFACIAL FINDINGS Osseous: Minimally displaced nasal bone fractures evident. No other acute facial bone fracture evident. No mandibular fracture or dislocation. No destructive process. Orbits: Negative. No traumatic or inflammatory  finding. Sinuses: Surgical changes are noted in the maxillary sinuses bilaterally chronic mucosal disease is seen in the ethmoid and frontal sinuses. Air-fluid level noted left sphenoid sinus and left maxillary sinus. Soft tissues: Edema and skin irregularity in the right supraorbital scalp is compatible with the reported history of laceration. IMPRESSION: 1. No acute intracranial abnormality. 2. Minimally displaced nasal bone fractures. No other maxillofacial fracture on the current study. 3. 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 Air-fluid levels may be related to the reported history of nose bleed. Electronically Signed   By: Misty Stanley M.D.   On: 03/22/2022 07:48  ? ?DG Chest Portable 1 View ? ?Result Date: 03/22/2022 ?CLINICAL DATA:  Syncope this morning EXAM: PORTABLE CHEST 1 VIEW COMPARISON:  02/01/2022 FINDINGS: Normal heart size. Negative mediastinal contours. There is distortion from rightward rotation. Borderline atelectasis at the bases. There is no edema, consolidation, effusion, or pneumothorax. IMPRESSION: No evidence of active disease. Electronically Signed   By: Jorje Guild M.D.   On: 03/22/2022 07:36  ? ?CT Maxillofacial Wo Contrast ? ?Result Date: 03/22/2022 ?CLINICAL DATA:  Syncopal episode getting out of bed this morning. Fall with facial laceration in the right eyebrow region. Nose bleed. EXAM: CT HEAD WITHOUT CONTRAST CT MAXILLOFACIAL WITHOUT CONTRAST TECHNIQUE: Multidetector CT imaging of the head and maxillofacial structures were performed using the  standard protocol without intravenous contrast. Multiplanar CT image reconstructions of the maxillofacial structures were also generated. RADIATION DOSE REDUCTION: This exam was performed according to the departmental dose-optimization program which includes automated exposure control, adjustment of the mA and/or kV according to patient size and/or use of iterative reconstruction  technique. COMPARISON:  None. FINDINGS: CT HEAD FINDINGS Brain: There is no evidence for acute hemorrhage, hydrocephalus, mass lesion, or abnormal extra-axial fluid collection. No definite CT evidence f

## 2022-03-23 ENCOUNTER — Inpatient Hospital Stay: Payer: Medicare Other | Admitting: Internal Medicine

## 2022-03-25 ENCOUNTER — Ambulatory Visit (INDEPENDENT_AMBULATORY_CARE_PROVIDER_SITE_OTHER): Payer: Medicare Other | Admitting: Internal Medicine

## 2022-03-25 ENCOUNTER — Encounter: Payer: Self-pay | Admitting: Internal Medicine

## 2022-03-25 VITALS — BP 130/78 | Temp 98.1°F | Ht 72.0 in | Wt 192.0 lb

## 2022-03-25 DIAGNOSIS — I2583 Coronary atherosclerosis due to lipid rich plaque: Secondary | ICD-10-CM

## 2022-03-25 DIAGNOSIS — S022XXD Fracture of nasal bones, subsequent encounter for fracture with routine healing: Secondary | ICD-10-CM

## 2022-03-25 DIAGNOSIS — I2510 Atherosclerotic heart disease of native coronary artery without angina pectoris: Secondary | ICD-10-CM

## 2022-03-25 DIAGNOSIS — J339 Nasal polyp, unspecified: Secondary | ICD-10-CM | POA: Diagnosis not present

## 2022-03-25 DIAGNOSIS — R55 Syncope and collapse: Secondary | ICD-10-CM | POA: Insufficient documentation

## 2022-03-25 NOTE — Assessment & Plan Note (Signed)
Cardiology f/u ?

## 2022-03-25 NOTE — Progress Notes (Signed)
Subjective:  Patient ID: Shawn Orozco, male    DOB: [birth year]  Age: 76 y.o. MRN: [id number]  CC: No chief complaint on file.   HPI Shawn Orozco presents for post-ER visit for syncope on Mon 03/22/22 at 6:15am, he stood up and passed out: hit the floor w/his face.   Outpatient Medications Prior to Visit  Medication Sig Dispense Refill   aspirin EC 81 MG tablet Take 1 tablet (81 mg total) by mouth daily. 100 tablet 3   benzonatate (TESSALON) 200 MG capsule Take 1 capsule (200 mg total) by mouth 2 (two) times daily as needed for cough. 20 capsule 0   Cholecalciferol (VITAMIN D3) 50 MCG (2000 UT) capsule Take 1 capsule (2,000 Units total) by mouth daily. (Patient not taking: Reported on 04/08/2022) 100 capsule 3   Hypertonic Nasal Wash (SINUS RINSE) PACK Place into the nose daily.     ketoconazole (NIZORAL) 2 % cream APPLY A SMALL AMOUNT TOPICALLY BETWEEN THE TOES AND TO AFFECTED AREA(S) TWO TIMES A DAY AS NEEDED     loratadine (CLARITIN) 10 MG tablet Take 1 tablet (10 mg total) by mouth daily. 30 tablet 11   Niacinamide 500 MG TBCR Take 500 mg by mouth daily.     omeprazole (PRILOSEC) 20 MG capsule Take 1 capsule (20 mg total) by mouth daily. 90 capsule 0   rosuvastatin (CRESTOR) 10 MG tablet Take 1 tablet (10 mg total) by mouth daily. 90 tablet 3   albuterol (VENTOLIN HFA) 108 (90 Base) MCG/ACT inhaler Inhale 2 puffs into the lungs 2 (two) times daily for 5 days. 1 each 3   No facility-administered medications prior to visit.    ROS: Review of Systems  Constitutional:  Negative for appetite change, fatigue and unexpected weight change.  HENT:  Negative for congestion, nosebleeds, sneezing, sore throat and trouble swallowing.   Eyes:  Negative for itching and visual disturbance.  Respiratory:  Negative for cough.   Cardiovascular:  Negative for chest pain, palpitations and leg swelling.  Gastrointestinal:  Negative for abdominal distention, blood in stool, diarrhea and nausea.   Genitourinary:  Negative for frequency and hematuria.  Musculoskeletal:  Negative for back pain, gait problem, joint swelling and neck pain.  Skin:  Positive for wound. Negative for rash.  Neurological:  Positive for syncope. Negative for dizziness, tremors, speech difficulty and weakness.  Psychiatric/Behavioral:  Negative for agitation, dysphoric mood and sleep disturbance. The patient is not nervous/anxious.    Objective:  BP 130/78 (BP Location: Left Arm, Patient Position: Sitting, Cuff Size: Normal)   Temp 98.1 F (36.7 C) (Oral)   Ht 6' (1.829 m)   Wt 192 lb (87.1 kg)   SpO2 96%   BMI 26.04 kg/m   BP Readings from Last 3 Encounters:  04/08/22 122/72  03/29/22 138/78  03/25/22 130/78    Wt Readings from Last 3 Encounters:  04/08/22 196 lb (88.9 kg)  03/29/22 195 lb (88.5 kg)  03/25/22 192 lb (87.1 kg)    Physical Exam Constitutional:      General: He is not in acute distress.    Appearance: He is well-developed.     Comments: NAD  Eyes:     Conjunctiva/sclera: Conjunctivae normal.     Pupils: Pupils are equal, round, and reactive to light.  Neck:     Thyroid: No thyromegaly.     Vascular: No JVD.  Cardiovascular:     Rate and Rhythm: Normal rate and regular rhythm.     Heart sounds:  Normal heart sounds. No murmur heard.   No friction rub. No gallop.  Pulmonary:     Effort: Pulmonary effort is normal. No respiratory distress.     Breath sounds: Normal breath sounds. No wheezing or rales.  Chest:     Chest wall: No tenderness.  Abdominal:     General: Bowel sounds are normal. There is no distension.     Palpations: Abdomen is soft. There is no mass.     Tenderness: There is no abdominal tenderness. There is no guarding or rebound.  Musculoskeletal:        General: No tenderness. Normal range of motion.     Cervical back: Normal range of motion.  Lymphadenopathy:     Cervical: No cervical adenopathy.  Skin:    General: Skin is warm and dry.     Findings:  Bruising present. No rash.  Neurological:     Mental Status: He is alert and oriented to person, place, and time.     Cranial Nerves: No cranial nerve deficit.     Motor: No abnormal muscle tone.     Coordination: Coordination normal.     Gait: Gait normal.     Deep Tendon Reflexes: Reflexes are normal and symmetric.  Psychiatric:        Behavior: Behavior normal.        Thought Content: Thought content normal.        Judgment: Judgment normal.   Right eyebrow laceration with sutures R eyebrow sutures removed    A total time of 45 minutes was spent preparing to see the patient, reviewing tests, x-rays, operative reports and other medical records.  Also, obtaining history and performing comprehensive physical exam.  Additionally, counseling the patient regarding the above listed issues.   Finally, documenting clinical information in the health records, coordination of care, educating the patient and removing sutures. It is a complex case.    Lab Results  Component Value Date   WBC 7.6 03/22/2022   HGB 15.7 03/22/2022   HCT 44.2 03/22/2022   PLT 192 03/22/2022   GLUCOSE 151 (H) 03/22/2022   CHOL 140 03/03/2022   TRIG 132.0 03/03/2022   HDL 47.00 03/03/2022   LDLCALC 67 03/03/2022   ALT 19 03/22/2022   AST 20 03/22/2022   NA 139 03/22/2022   K 3.7 03/22/2022   CL 108 03/22/2022   CREATININE 1.15 03/22/2022   BUN 20 03/22/2022   CO2 25 03/22/2022   TSH 2.30 03/03/2022    CT Head Wo Contrast  Result Date: 03/22/2022 CLINICAL DATA:  Syncopal episode getting out of bed this morning. Fall with facial laceration in the right eyebrow region. Nose bleed. EXAM: CT HEAD WITHOUT CONTRAST CT MAXILLOFACIAL WITHOUT CONTRAST TECHNIQUE: Multidetector CT imaging of the head and maxillofacial structures were performed using the standard protocol without intravenous contrast. Multiplanar CT image reconstructions of the maxillofacial structures were also generated. RADIATION DOSE REDUCTION: This  exam was performed according to the departmental dose-optimization program which includes automated exposure control, adjustment of the mA and/or kV according to patient size and/or use of iterative reconstruction technique. COMPARISON:  None. FINDINGS: CT HEAD FINDINGS Brain: There is no evidence for acute hemorrhage, hydrocephalus, mass lesion, or abnormal extra-axial fluid collection. No definite CT evidence for acute infarction. Vascular: No hyperdense vessel or unexpected calcification. Skull: No evidence for fracture. No worrisome lytic or sclerotic lesion. Other: None. CT MAXILLOFACIAL FINDINGS Osseous: Minimally displaced nasal bone fractures evident. No other acute facial bone fracture evident.  No mandibular fracture or dislocation. No destructive process. Orbits: Negative. No traumatic or inflammatory finding. Sinuses: Surgical changes are noted in the maxillary sinuses bilaterally chronic mucosal disease is seen in the ethmoid and frontal sinuses. Air-fluid level noted left sphenoid sinus and left maxillary sinus. Soft tissues: Edema and skin irregularity in the right supraorbital scalp is compatible with the reported history of laceration. IMPRESSION: 1. No acute intracranial abnormality. 2. Minimally displaced nasal bone fractures. No other maxillofacial fracture on the current study. 3. 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 Air-fluid levels may be related to the reported history of nose bleed. Electronically Signed   By: Misty Stanley M.D.   On: 03/22/2022 07:48   DG Chest Portable 1 View  Result Date: 03/22/2022 CLINICAL DATA:  Syncope this morning EXAM: PORTABLE CHEST 1 VIEW COMPARISON:  02/01/2022 FINDINGS: Normal heart size. Negative mediastinal contours. There is distortion from rightward rotation. Borderline atelectasis at the bases. There is no edema, consolidation, effusion, or pneumothorax. IMPRESSION: No evidence of active  disease. Electronically Signed   By: Jorje Guild M.D.   On: 03/22/2022 07:36   CT Maxillofacial Wo Contrast  Result Date: 03/22/2022 CLINICAL DATA:  Syncopal episode getting out of bed this morning. Fall with facial laceration in the right eyebrow region. Nose bleed. EXAM: CT HEAD WITHOUT CONTRAST CT MAXILLOFACIAL WITHOUT CONTRAST TECHNIQUE: Multidetector CT imaging of the head and maxillofacial structures were performed using the standard protocol without intravenous contrast. Multiplanar CT image reconstructions of the maxillofacial structures were also generated. RADIATION DOSE REDUCTION: This exam was performed according to the departmental dose-optimization program which includes automated exposure control, adjustment of the mA and/or kV according to patient size and/or use of iterative reconstruction technique. COMPARISON:  None. FINDINGS: CT HEAD FINDINGS Brain: There is no evidence for acute hemorrhage, hydrocephalus, mass lesion, or abnormal extra-axial fluid collection. No definite CT evidence for acute infarction. Vascular: No hyperdense vessel or unexpected calcification. Skull: No evidence for fracture. No worrisome lytic or sclerotic lesion. Other: None. CT MAXILLOFACIAL FINDINGS Osseous: Minimally displaced nasal bone fractures evident. No other acute facial bone fracture evident. No mandibular fracture or dislocation. No destructive process. Orbits: Negative. No traumatic or inflammatory finding. Sinuses: Surgical changes are noted in the maxillary sinuses bilaterally chronic mucosal disease is seen in the ethmoid and frontal sinuses. Air-fluid level noted left sphenoid sinus and left maxillary sinus. Soft tissues: Edema and skin irregularity in the right supraorbital scalp is compatible with the reported history of laceration. IMPRESSION: 1. No acute intracranial abnormality. 2. Minimally displaced nasal bone fractures. No other maxillofacial fracture on the current study. 3. Chronic  paranasal sinus disease with air-fluid levels in the left sphenoid and left maxillary sinuses. Air-fluid levels may be related to the reported history of nose bleed. Electronically Signed   By: Misty Stanley M.D.   On: 03/22/2022 07:48    Assessment & Plan:   Problem List Items Addressed This Visit     Syncope - Primary    Orthostatic       Relevant Orders   Ambulatory referral to Cardiology   Nasal polyp    ENT referal        Relevant Orders   Ambulatory referral to ENT   Fractured nose    ENT ref  CT with minimally displaced nasal bone fractures. No other maxillofacial fracture on the current study. 3. Chronic paranasal sinus disease with air-fluid levels in the left sphenoid and  left maxillary sinuses. Air-fluid levels may be related to the reported history of nose bleed.       Relevant Orders   Ambulatory referral to ENT   Coronary atherosclerosis    Cardiology f/u          No orders of the defined types were placed in this encounter.     Follow-up: Return in about 4 weeks (around 04/22/2022) for a follow-up visit.  Walker Kehr, MD

## 2022-03-25 NOTE — Assessment & Plan Note (Signed)
ENT referal ? ?

## 2022-03-25 NOTE — Assessment & Plan Note (Signed)
Orthostatic

## 2022-03-25 NOTE — Assessment & Plan Note (Signed)
ENT ref ? ?CT with minimally displaced nasal bone fractures. No other maxillofacial ?fracture on the current study. ?3. Chronic paranasal sinus disease with air-fluid levels in the left ?sphenoid and left maxillary sinuses. Air-fluid levels may be related ?to the reported history of nose bleed. ?

## 2022-03-29 ENCOUNTER — Ambulatory Visit: Payer: Medicare Other

## 2022-03-29 ENCOUNTER — Ambulatory Visit (INDEPENDENT_AMBULATORY_CARE_PROVIDER_SITE_OTHER): Payer: Medicare Other

## 2022-03-29 ENCOUNTER — Encounter: Payer: Self-pay | Admitting: Family Medicine

## 2022-03-29 ENCOUNTER — Ambulatory Visit (INDEPENDENT_AMBULATORY_CARE_PROVIDER_SITE_OTHER): Payer: Medicare Other | Admitting: Family Medicine

## 2022-03-29 VITALS — BP 138/78 | HR 75 | Ht 72.0 in | Wt 195.0 lb

## 2022-03-29 DIAGNOSIS — M545 Low back pain, unspecified: Secondary | ICD-10-CM

## 2022-03-29 MED ORDER — VITAMIN D (ERGOCALCIFEROL) 1.25 MG (50000 UNIT) PO CAPS: 50000.0000 [IU] | ORAL_CAPSULE | ORAL | 0 refills | Status: DC

## 2022-03-29 NOTE — Patient Instructions (Addendum)
?  Good to see you  ?I think there might be a small fracture.  ?Be careful  ?Once weekly vitamin D for 12 weeks  ?Stop the daily vitamin D  ?Start K2 253mg daily for 1 month.  ?No lifting more then 10# for 2 weeks and then 25# thereafter ?See me again in 4 weeks but do not hesitate if something worsens or new symptoms occur  ? ?

## 2022-03-29 NOTE — Assessment & Plan Note (Signed)
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  In addition of this patient though does have an abnormality of the proximal vertebral body seems to be right greater than left that is concerning for the possibility of a small fracture.  We discussed with patient at this time about even advanced imaging with patient having a history of prostate cancer but patient would like to try conservative therapy initially.  We discussed icing regimen and home exercises, which activities to do which ones to avoid, increase activity slowly otherwise.  Patient knows if worsening pain to will come back sooner or seek medical attention.  Vitamin D supplementation prescribed follow-up with me again in 3 weeks ?

## 2022-03-29 NOTE — Progress Notes (Signed)
?Charlann Boxer D.O. ?[facility] ?[place] ?Phone: [phone] ?Subjective:   ?I, Jacqualin Combes, am serving as a scribe for Dr. Hulan Saas. ? ?This visit occurred during the SARS-CoV-2 public health emergency.  Safety protocols were in place, including screening questions prior to the visit, additional usage of staff PPE, and extensive cleaning of exam room while observing appropriate contact time as indicated for disinfecting solutions.  ? ?I'm seeing this patient by the request  of:  Plotnikov, Evie Lacks, MD ? ?CC: Back pain ? ?[id number]  ?Shawn Orozco is a 76 y.o. male coming in with complaint of back pain. Seen in December 2022 for foot pain. Patient states that 3 weeks ago he was mowing the lawn on a bank and front wheel caught on a wire. Patient was pulling on mower and slipped and twisted. Next day he felt pain in muscles of lumbar spine. Will have sharp, grabbing pain with slight lumbar flexion. Having trouble sleeping at night. If patient tries to roll on side he cannot stay in this position. Once patient starts moving around the muscles will improve.   ?Reviewing patient's notes though patient recently did have a syncopal event but states that the back pain was before he had the syncopal event.  Reviewed patient's CT scan of the head that was unremarkable. ?  ? ?Past Medical History:  ?Diagnosis Date  ? Arthritis   ? Cataract   ? removed bilat 2014 with lens implants   ? Colon polyps   ? Deviated septum   ? GERD (gastroesophageal reflux disease)   ? Hyperlipidemia   ? preventative for FH   ? Prostate cancer [facility]) 2021  ? 04-14-2020 prostate surgery   ? ?Past Surgical History:  ?Procedure Laterality Date  ? CATARACT EXTRACTION, BILATERAL    ? COLONOSCOPY    ? KNEE ARTHROSCOPY Right   ? NASAL SEPTUM SURGERY    ? POLYPECTOMY    ? PROSTATECTOMY  04/14/2020  ? SHOULDER ARTHROSCOPY Right   ? TONSILLECTOMY    ? ?Social History  ? ?Socioeconomic History  ? Marital  status: Divorced  ?  Spouse name: Not on file  ? Number of children: 0  ? Years of education: Not on file  ? Highest education level: Not on file  ?Occupational History  ? Occupation: retired  ?Tobacco Use  ? Smoking status: Never  ? Smokeless tobacco: Never  ?Vaping Use  ? Vaping Use: Never used  ?Substance and Sexual Activity  ? Alcohol use: Never  ? Drug use: Never  ? Sexual activity: Not Currently  ?Other Topics Concern  ? Not on file  ?Social History Narrative  ? Not on file  ? ?Social Determinants of Health  ? ?Financial Resource Strain: Low Risk   ? Difficulty of Paying Living Expenses: Not hard at all  ?Food Insecurity: No Food Insecurity  ? Worried About Charity fundraiser in the Last Year: Never true  ? Ran Out of Food in the Last Year: Never true  ?Transportation Needs: No Transportation Needs  ? Lack of Transportation (Medical): No  ? Lack of Transportation (Non-Medical): No  ?Physical Activity: Inactive  ? Days of Exercise per Week: 0 days  ? Minutes of Exercise per Session: 0 min  ?Stress: No Stress Concern Present  ? Feeling of Stress : Not at all  ?Social Connections: Moderately Integrated  ? Frequency of Communication with Friends and Family: More than three times a week  ? Frequency  of Social Gatherings with Friends and Family: More than three times a week  ? Attends Religious Services: More than 4 times per year  ? Active Member of Clubs or Organizations: Yes  ? Attends Archivist Meetings: More than 4 times per year  ? Marital Status: Never married  ? ?No Known Allergies ?Family History  ?Problem Relation Age of Onset  ? Alzheimer's disease Mother   ? Alzheimer's disease Brother   ? Heart disease Brother   ? Heart disease Brother   ? Colon cancer Neg Hx   ? Colon polyps Neg Hx   ? Esophageal cancer Neg Hx   ? Rectal cancer Neg Hx   ? Stomach cancer Neg Hx   ? ? ? ?Current Outpatient Medications (Cardiovascular):  ?  rosuvastatin (CRESTOR) 10 MG tablet, Take 1 tablet (10 mg total) by  mouth daily. ? ?Current Outpatient Medications (Respiratory):  ?  benzonatate (TESSALON) 200 MG capsule, Take 1 capsule (200 mg total) by mouth 2 (two) times daily as needed for cough. ?  Hypertonic Nasal Wash (SINUS RINSE) PACK, Place into the nose daily. ?  loratadine (CLARITIN) 10 MG tablet, Take 1 tablet (10 mg total) by mouth daily. ?  albuterol (VENTOLIN HFA) 108 (90 Base) MCG/ACT inhaler, Inhale 2 puffs into the lungs 2 (two) times daily for 5 days. ? ?Current Outpatient Medications (Analgesics):  ?  aspirin EC 81 MG tablet, Take 1 tablet (81 mg total) by mouth daily. ? ? ?Current Outpatient Medications (Other):  ?  Cholecalciferol (VITAMIN D3) 50 MCG (2000 UT) capsule, Take 1 capsule (2,000 Units total) by mouth daily. ?  ketoconazole (NIZORAL) 2 % cream, APPLY A SMALL AMOUNT TOPICALLY BETWEEN THE TOES AND TO AFFECTED AREA(S) TWO TIMES A DAY AS NEEDED ?  Niacinamide 500 MG TBCR, Take 500 mg by mouth daily. ?  omeprazole (PRILOSEC) 20 MG capsule, Take 1 capsule (20 mg total) by mouth daily. ?  Vitamin D, Ergocalciferol, (DRISDOL) 1.25 MG (50000 UNIT) CAPS capsule, Take 1 capsule (50,000 Units total) by mouth every 7 (seven) days. ? ? ?Reviewed prior external information including notes and imaging from  ?primary care provider ?As well as notes that were available from care everywhere and other healthcare systems. ? ?Past medical history, social, surgical and family history all reviewed in electronic medical record.  No pertanent information unless stated regarding to the chief complaint.  ? ?Review of Systems: ? No headache, visual changes, nausea, vomiting, diarrhea, constipation, dizziness, abdominal pain, skin rash, fevers, chills, night sweats, weight loss, swollen lymph nodes, body aches, joint swelling, chest pain, shortness of breath, mood changes. POSITIVE muscle aches ? ?Objective  ?Blood pressure 138/78, pulse 75, height 6' (1.829 m), weight 195 lb (88.5 kg), SpO2 96 %. ?  ?General: No apparent  distress alert and oriented x3 mood and affect normal, dressed appropriately.  ?HEENT: Pupils equal, extraocular movements intact  ?Respiratory: Patient's speak in full sentences and does not appear short of breath  ?Cardiovascular: No lower extremity edema, non tender, no erythema  ?Gait normal with good balance and coordination.  ?MSK: Low back exam does have some loss of lordosis.  Significant tightness noted of the hips bilaterally.  Patient has limited internal and external range of motion consistent mostly though with some arthritic changes.  Patient does have some mild midline tenderness mostly over the L5 area. ? ?50354; 15 additional minutes spent for Therapeutic exercises as stated in above notes.  This included exercises focusing on stretching, strengthening,  with significant focus on eccentric aspects.   Long term goals include an improvement in range of motion, strength, endurance as well as avoiding reinjury. Patient's frequency would include in 1-2 times a day, 3-5 times a week for a duration of 6-12 weeks.  Low back exercises that included:  ?Pelvic tilt/bracing instruction to focus on control of the pelvic girdle and lower abdominal muscles  ?Glute strengthening exercises, focusing on proper firing of the glutes without engaging the low back muscles ?Proper stretching techniques for maximum relief for the hamstrings, hip flexors, low back and some rotation where tolerated ? Proper technique shown and discussed handout in great detail with ATC.  All questions were discussed and answered.  ? ? ?  ?Impression and Recommendations:  ?  ? ?The above documentation has been reviewed and is accurate and complete Lyndal Pulley, DO ? ? ? ?

## 2022-04-07 NOTE — Progress Notes (Signed)
Cardiology Office Note:    Date:  04/08/2022   ID:  Shawn Orozco, DOB 1946-05-10, MRN [id number]  PCP:  Cassandria Anger, MD   [facility] Providers Cardiologist:  Werner Lean, MD     Referring MD: Cassandria Anger, MD   Chief Complaint: follow-up for syncope  History of Present Illness:    Shawn Orozco is a very pleasant 76 y.o. male with a hx of coronary atherosclerosis, RBBB, GERD, hyperlipidemia Oncological History notable for: Malignancies: prostate cancer Surgery: 03/2020 Chemotherapy: None Radiation: none Oncology care spearheaded by: Presently in [place]  He was initially seen by Dr. Glenford Bayley on 01/05/2021 for coronary artery calcification seen on imaging.  He reported prior cardiac testing including 2021 stress test in [place] prior to prostate surgery.  He had just recently started rosuvastatin.  He was last seen on 05/07/2021 at which time LDL remained above goal and he was asked to increase his rosuvastatin to 10 mg daily.  1 year follow-up was recommended.  On 03/22/2022 he presented to the emergency department for syncope that occurred when getting out of bed that morning.  He reported he had woken up feeling well, swelling his legs over the edge of the bed and went to stand when he suddenly lost consciousness.  He feels that he fell forward hitting his head on the floor and almost immediately regained consciousness.  He called his family and subsequently was taken to the emergency department. He suffered facial trauma with eyebrow laceration. No evidence of orthostasis in ED, EKG unremarkable.  Today, he is here alone for follow-up.  He reports the events on 03/22/2022 as waking up with his alarm at 0600.  Laid in bed for another 15 minutes and then swung his legs over the side of the bed and then wakes up on floor with blood around him.  He thinks he quickly regained consciousness.  He got up and called his family and got himself cleaned up and had no  further dizziness or headache.  He reports that the day prior was a normal day, he went to church and then to lunch, watched sports on tv that afternoon and evening. He did not exert himself physically, did not spend time outdoors, and no alcohol consumption. Admits he does not drink much water unless he is working outside.  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 Does not get up during the night to urinate. He denies any symptoms or episodes of presyncope or syncope since 5/1. He denies chest pain, shortness of breath, lower extremity edema, fatigue, palpitations, melena, hematuria, hemoptysis, diaphoresis, weakness, orthopnea, and PND.  We do not have records of prior cardiac testing. He is unsure of where the testing occurred but I have asked him if he finds that information to please call and notify us so that we can have copies faxed. Blood pressure is well-controlled.   Past Medical History:  Diagnosis Date   Arthritis    Cataract    removed bilat 2014 with lens implants    Colon polyps    Deviated septum    GERD (gastroesophageal reflux disease)    Hyperlipidemia    preventative for FH    Prostate cancer [facility]) 2021   04-14-2020 prostate surgery     Past Surgical History:  Procedure Laterality Date  CATARACT EXTRACTION, BILATERAL     COLONOSCOPY     KNEE ARTHROSCOPY Right    NASAL SEPTUM SURGERY     POLYPECTOMY     PROSTATECTOMY  04/14/2020   SHOULDER ARTHROSCOPY Right    TONSILLECTOMY      Current Medications: Current Meds  Medication Sig   aspirin EC 81 MG tablet Take 1 tablet (81 mg total) by mouth daily.   benzonatate (TESSALON) 200 MG capsule Take 1 capsule (200 mg total) by mouth 2 (two) times daily as needed for cough.   Hypertonic Nasal Wash (SINUS RINSE) PACK Place into the nose daily.    ketoconazole (NIZORAL) 2 % cream APPLY A SMALL AMOUNT TOPICALLY BETWEEN THE TOES AND TO AFFECTED AREA(S) TWO TIMES A DAY AS NEEDED   loratadine (CLARITIN) 10 MG tablet Take 1 tablet (10 mg total) by mouth daily.   Niacinamide 500 MG TBCR Take 500 mg by mouth daily.   omeprazole (PRILOSEC) 20 MG capsule Take 1 capsule (20 mg total) by mouth daily.   rosuvastatin (CRESTOR) 10 MG tablet Take 1 tablet (10 mg total) by mouth daily.   Vitamin D, Ergocalciferol, (DRISDOL) 1.25 MG (50000 UNIT) CAPS capsule Take 1 capsule (50,000 Units total) by mouth every 7 (seven) days.     Allergies:   Patient has no known allergies.   Social History   Socioeconomic History   Marital status: Divorced    Spouse name: Not on file   Number of children: 0   Years of education: Not on file   Highest education level: Not on file  Occupational History   Occupation: retired  Tobacco Use   Smoking status: Never   Smokeless tobacco: Never  Vaping Use   Vaping Use: Never used  Substance and Sexual Activity   Alcohol use: Never   Drug use: Never   Sexual activity: Not Currently  Other Topics Concern   Not on file  Social History Narrative   Not on file   Social Determinants of Health   Financial Resource Strain: Low Risk    Difficulty of Paying Living Expenses: Not hard at all  Food Insecurity: No Food Insecurity   Worried About Charity fundraiser in the Last Year: Never true   [employer] in the Last Year: Never true  Transportation Needs: No Transportation Needs   Lack of Transportation (Medical): No   Lack of Transportation (Non-Medical): No  Physical Activity: Inactive   Days of Exercise per Week: 0 days   Minutes of Exercise per Session: 0 min  Stress: No Stress Concern Present   Feeling of Stress : Not at all  Social Connections: Moderately Integrated   Frequency of Communication with Friends and Family: More than three times a week   Frequency of Social Gatherings with Friends and  Family: More than three times a week   Attends Religious Services: More than 4 times per year   Active Member of [employer] or Organizations: Yes   Attends Music therapist: More than 4 times per year   Marital Status: Never married     Family History: The patient's family history includes Alzheimer's disease in his brother and mother; Heart disease in his brother and brother. There is no history of Colon cancer, Colon polyps, Esophageal cancer, Rectal cancer, or Stomach cancer.  ROS:   Please see the history of present illness.  All other systems reviewed and are negative.  Labs/Other Studies Reviewed:    The following studies were  reviewed today:  CT Cardiac Scoring 12/24/20  Coronary calcium score of 118. This was 36th percentile for age and sex matched control.   Recent Labs: 03/03/2022: TSH 2.30 03/22/2022: ALT 19; BUN 20; Creatinine, Ser 1.15; Hemoglobin 15.7; Platelets 192; Potassium 3.7; Sodium 139  Recent Lipid Panel    Component Value Date/Time   CHOL 140 03/03/2022 0842   CHOL 154 05/07/2021 0732   TRIG 132.0 03/03/2022 0842   HDL 47.00 03/03/2022 0842   HDL 45 05/07/2021 0732   CHOLHDL 3 03/03/2022 0842   VLDL 26.4 03/03/2022 0842   LDLCALC 67 03/03/2022 0842   LDLCALC 88 05/07/2021 0732     Risk Assessment/Calculations:       Physical Exam:    VS:  BP 122/72   Pulse 76   Ht 6' (1.829 m)   Wt 196 lb (88.9 kg)   BMI 26.58 kg/m     Wt Readings from Last 3 Encounters:  04/08/22 196 lb (88.9 kg)  03/29/22 195 lb (88.5 kg)  03/25/22 192 lb (87.1 kg)     GEN:  Well nourished, well developed in no acute distress HEENT: Normal NECK: No JVD; No carotid bruits CARDIAC: RRR, no murmurs, rubs, gallops RESPIRATORY:  Clear to auscultation without rales, wheezing or rhonchi  ABDOMEN: Soft, non-tender, protruding abdomen MUSCULOSKELETAL:  No edema; No deformity. 2+ pedal pulses, equal bilaterally SKIN: Warm and dry NEUROLOGIC:  Alert and oriented x  3 PSYCHIATRIC:  Normal affect   EKG:  EKG is ordered today.  The ekg ordered today demonstrates NSR at 76 bpm, RBBB, LAFB, criteria for LVH  Diagnoses:    1. Vasovagal syncope   2. RBBB   3. Left anterior fascicular block   4. Coronary atherosclerosis due to lipid rich plaque   5. Hyperlipidemia LDL goal <70    Assessment and Plan:     Syncope: One episode of what sounds like vasovagal syncope on 03/22/2022.  No recurring symptoms. I have asked him to report any further episodes to [place]. Would favor cardiac monitor if this occurs again or if he has presyncope.   Coronary artery calcification: Coronary calcium score of 118, 36 percentile for age and sex matched control. He denies chest pain, dyspnea, or other symptoms concerning for angina. No indication for further ischemic evaluation at this time.  Normal stress test per patient prior to prostatectomy in 2021. Encouraged him to increase physical activity to achieve 150 minutes of moderate intensity exercise each week.  Hyperlipidemia LDL goal < 70: LDL 67 on 03/03/2022.  Encouraged mostly plant-based diet low in saturated fat and regular physical activity. Continue rosuvastatin.   RBBB/LAFB: Seen on previous EKG.  We do not have documentation of prior echocardiogram.  Patient thinks he may have had one 2 years ago in [facility].  We will update echocardiogram for evaluation of LV function.     Disposition:  6 months with Dr. Gasper Sells  Medication Adjustments/Labs and Tests Ordered: Current medicines are reviewed at length with the patient today.  Concerns regarding medicines are outlined above.  Orders Placed This Encounter  Procedures   EKG 12-Lead   ECHOCARDIOGRAM COMPLETE   No orders of the defined types were placed in this encounter.   Patient Instructions  Medication Instructions:  Your physician recommends that you continue on your current medications as directed. Please refer to the Current Medication list given to you  today.  *If you need a refill on your cardiac medications before your next appointment, please call your  pharmacy*   Lab Work: None ordered  If you have labs (blood work) drawn today and your tests are completely normal, you will receive your results only by: [place] (if you have MyChart) OR A paper copy in the mail If you have any lab test that is abnormal or we need to change your treatment, we will call you to review the results.   Testing/Procedures: Your physician has requested that you have an echocardiogram. Echocardiography is a painless test that uses sound waves to create images of your heart. It provides your doctor with information about the size and shape of your heart and how well your heart's chambers and valves are working. This procedure takes approximately one hour. There are no restrictions for this procedure.    Follow-Up: At [facility], you and your health needs are our priority.  As part of our continuing mission to provide you with exceptional heart care, we have created designated Provider Care Teams.  These Care Teams include your primary Cardiologist (physician) and Advanced Practice Providers (APPs -  Physician Assistants and Nurse Practitioners) who all work together to provide you with the care you need, when you need it.  We recommend signing up for the patient portal called "MyChart".  Sign up information is provided on this After Visit Summary.  MyChart is used to connect with patients for Virtual Visits (Telemedicine).  Patients are able to view lab/test results, encounter notes, upcoming appointments, etc.  Non-urgent messages can be sent to your provider as well.   To learn more about what you can do with MyChart, go to [email or web address].    Your next appointment:   6 month(s)  The format for your next appointment:   In Person  Provider:   Werner Lean, MD     Other Instructions  Mediterranean Diet A Mediterranean  diet refers to food and lifestyle choices that are based on the traditions of countries located on the [place]. It focuses on eating more fruits, vegetables, whole grains, beans, nuts, seeds, and heart-healthy fats, and eating less dairy, meat, eggs, and processed foods with added sugar, salt, and fat. This way of eating has been shown to help prevent certain conditions and improve outcomes for people who have chronic diseases, like kidney disease and heart disease. What are tips for following this plan? Reading food labels Check the serving size of packaged foods. For foods such as rice and pasta, the serving size refers to the amount of cooked product, not dry. Check the total fat in packaged foods. Avoid foods that have saturated fat or trans fats. Check the ingredient list for added sugars, such as corn syrup. Shopping  Buy a variety of foods that offer a balanced diet, including: Fresh fruits and vegetables (produce). Grains, beans, nuts, and seeds. Some of these may be available in unpackaged forms or large amounts (in bulk). Fresh seafood. Poultry and eggs. Low-fat dairy products. Buy whole ingredients instead of prepackaged foods. Buy fresh fruits and vegetables in-season from local farmers markets. Buy plain frozen fruits and vegetables. If you do not have access to quality fresh seafood, buy precooked frozen shrimp or canned fish, such as tuna, salmon, or sardines. Stock your pantry so you always have certain foods on hand, such as olive oil, canned tuna, canned tomatoes, rice, pasta, and beans. Cooking Cook foods with extra-virgin olive oil instead of using butter or other vegetable oils. Have meat as a side dish, and have vegetables or grains as your main  dish. This means having meat in small portions or adding small amounts of meat to foods like pasta or stew. Use beans or vegetables instead of meat in common dishes like chili or lasagna. Experiment with different  cooking methods. Try roasting, broiling, steaming, and sauting vegetables. Add frozen vegetables to soups, stews, pasta, or rice. Add nuts or seeds for added healthy fats and plant protein at each meal. You can add these to yogurt, salads, or vegetable dishes. Marinate fish or vegetables using olive oil, lemon juice, garlic, and fresh herbs. Meal planning Plan to eat one vegetarian meal one day each week. Try to work up to two vegetarian meals, if possible. Eat seafood two or more times a week. Have healthy snacks readily available, such as: Vegetable sticks with hummus. Greek yogurt. Fruit and nut trail mix. Eat balanced meals throughout the week. This includes: Fruit: 2-3 servings a day. Vegetables: 4-5 servings a day. Low-fat dairy: 2 servings a day. Fish, poultry, or lean meat: 1 serving a day. Beans and legumes: 2 or more servings a week. Nuts and seeds: 1-2 servings a day. Whole grains: 6-8 servings a day. Extra-virgin olive oil: 3-4 servings a day. Limit red meat and sweets to only a few servings a month. Lifestyle  Cook and eat meals together with your family, when possible. Drink enough fluid to keep your urine pale yellow. Be physically active every day. This includes: Aerobic exercise like running or swimming. Leisure activities like gardening, walking, or housework. Get 7-8 hours of sleep each night. If recommended by your health care provider, drink red wine in moderation. This means 1 glass a day for nonpregnant women and 2 glasses a day for men. A glass of wine equals 5 oz (150 mL). What foods should I eat? Fruits Apples. Apricots. Avocado. Berries. Bananas. Cherries. Dates. Figs. Grapes. Lemons. Melon. Oranges. Peaches. Plums. Pomegranate. Vegetables Artichokes. Beets. Broccoli. Cabbage. Carrots. Eggplant. Green beans. Chard. Kale. Spinach. Onions. Leeks. Peas. Squash. Tomatoes. Peppers. Radishes. Grains Whole-grain pasta. Brown rice. Bulgur wheat. Polenta.  Couscous. Whole-wheat bread. [place]. Meats and other proteins Beans. Almonds. Sunflower seeds. Pine nuts. Peanuts. [place]. Salmon. Scallops. Shrimp. [place] [employer]. Tilapia. Clams. Oysters. Eggs. Poultry without skin. Dairy Low-fat milk. Cheese. Greek yogurt. Fats and oils Extra-virgin olive oil. Avocado oil. Grapeseed oil. Beverages Water. Red wine. Herbal tea. Sweets and desserts Greek yogurt with honey. Baked apples. Poached pears. Trail mix. Seasonings and condiments Basil. Cilantro. Coriander. Cumin. Mint. Parsley. Sage. Rosemary. Tarragon. Garlic. Oregano. Thyme. Pepper. Balsamic vinegar. Tahini. Hummus. Tomato sauce. Olives. Mushrooms. The items listed above may not be a complete list of foods and beverages you can eat. Contact a dietitian for more information. What foods should I limit? This is a list of foods that should be eaten rarely or only on special occasions. Fruits Fruit canned in syrup. Vegetables Deep-fried potatoes (french fries). Grains Prepackaged pasta or rice dishes. Prepackaged cereal with added sugar. Prepackaged snacks with added sugar. Meats and other proteins Beef. Pork. Lamb. Poultry with skin. Hot dogs. [place] Salines. Dairy Ice cream. Sour cream. Whole milk. Fats and oils Butter. Canola oil. Vegetable oil. Beef fat (tallow). Lard. Beverages Juice. Sugar-sweetened soft drinks. Beer. Liquor and spirits. Sweets and desserts Cookies. Cakes. Pies. Candy. Seasonings and condiments Mayonnaise. Pre-made sauces and marinades. The items listed above may not be a complete list of foods and beverages you should limit. Contact a dietitian for more information. Summary The Mediterranean diet includes both food and lifestyle choices. Eat a variety of fresh  fruits and vegetables, beans, nuts, seeds, and whole grains. Limit the amount of red meat and sweets that you eat. If recommended by your health care provider, drink red wine in moderation. This means 1 glass a day for  nonpregnant women and 2 glasses a day for men. A glass of wine equals 5 oz (150 mL). This information is not intended to replace advice given to you by your health care provider. Make sure you discuss any questions you have with your health care provider. Document Revised: 12/14/2019 Document Reviewed: 10/11/2019 Elsevier Patient Education  [place]         Signed, Emmaline Life, NP  04/08/2022 12:23 PM    [facility]

## 2022-04-08 ENCOUNTER — Encounter: Payer: Self-pay | Admitting: Nurse Practitioner

## 2022-04-08 ENCOUNTER — Ambulatory Visit (INDEPENDENT_AMBULATORY_CARE_PROVIDER_SITE_OTHER): Payer: Medicare Other | Admitting: Nurse Practitioner

## 2022-04-08 VITALS — BP 122/72 | HR 76 | Ht 72.0 in | Wt 196.0 lb

## 2022-04-08 DIAGNOSIS — I2510 Atherosclerotic heart disease of native coronary artery without angina pectoris: Secondary | ICD-10-CM

## 2022-04-08 DIAGNOSIS — I444 Left anterior fascicular block: Secondary | ICD-10-CM

## 2022-04-08 DIAGNOSIS — E785 Hyperlipidemia, unspecified: Secondary | ICD-10-CM

## 2022-04-08 DIAGNOSIS — R55 Syncope and collapse: Secondary | ICD-10-CM | POA: Diagnosis not present

## 2022-04-08 DIAGNOSIS — I4510 Unspecified right bundle-branch block: Secondary | ICD-10-CM | POA: Diagnosis not present

## 2022-04-08 DIAGNOSIS — I2583 Coronary atherosclerosis due to lipid rich plaque: Secondary | ICD-10-CM

## 2022-04-08 NOTE — Patient Instructions (Addendum)
Medication Instructions:  Your physician recommends that you continue on your current medications as directed. Please refer to the Current Medication list given to you today.  *If you need a refill on your cardiac medications before your next appointment, please call your pharmacy*   Lab Work: None ordered  If you have labs (blood work) drawn today and your tests are completely normal, you will receive your results only by: [place] (if you have MyChart) OR A paper copy in the mail If you have any lab test that is abnormal or we need to change your treatment, we will call you to review the results.   Testing/Procedures: Your physician has requested that you have an echocardiogram. Echocardiography is a painless test that uses sound waves to create images of your heart. It provides your doctor with information about the size and shape of your heart and how well your heart's chambers and valves are working. This procedure takes approximately one hour. There are no restrictions for this procedure.    Follow-Up: At [facility], you and your health needs are our priority.  As part of our continuing mission to provide you with exceptional heart care, we have created designated Provider Care Teams.  These Care Teams include your primary Cardiologist (physician) and Advanced Practice Providers (APPs -  Physician Assistants and Nurse Practitioners) who all work together to provide you with the care you need, when you need it.  We recommend signing up for the patient portal called "MyChart".  Sign up information is provided on this After Visit Summary.  MyChart is used to connect with patients for Virtual Visits (Telemedicine).  Patients are able to view lab/test results, encounter notes, upcoming appointments, etc.  Non-urgent messages can be sent to your provider as well.   To learn more about what you can do with MyChart, go to [email or web address].    Your next appointment:   6  month(s)  The format for your next appointment:   In Person  Provider:   Werner Lean, MD     Other Instructions  Mediterranean Diet A Mediterranean diet refers to food and lifestyle choices that are based on the traditions of countries located on the [place]. It focuses on eating more fruits, vegetables, whole grains, beans, nuts, seeds, and heart-healthy fats, and eating less dairy, meat, eggs, and processed foods with added sugar, salt, and fat. This way of eating has been shown to help prevent certain conditions and improve outcomes for people who have chronic diseases, like kidney disease and heart disease. What are tips for following this plan? Reading food labels Check the serving size of packaged foods. For foods such as rice and pasta, the serving size refers to the amount of cooked product, not dry. Check the total fat in packaged foods. Avoid foods that have saturated fat or trans fats. Check the ingredient list for added sugars, such as corn syrup. Shopping  Buy a variety of foods that offer a balanced diet, including: Fresh fruits and vegetables (produce). Grains, beans, nuts, and seeds. Some of these may be available in unpackaged forms or large amounts (in bulk). Fresh seafood. Poultry and eggs. Low-fat dairy products. Buy whole ingredients instead of prepackaged foods. Buy fresh fruits and vegetables in-season from local farmers markets. Buy plain frozen fruits and vegetables. If you do not have access to quality fresh seafood, buy precooked frozen shrimp or canned fish, such as tuna, salmon, or sardines. Stock your pantry so you always have certain foods  on hand, such as olive oil, canned tuna, canned tomatoes, rice, pasta, and beans. Cooking Cook foods with extra-virgin olive oil instead of using butter or other vegetable oils. Have meat as a side dish, and have vegetables or grains as your main dish. This means having meat in small portions or  adding small amounts of meat to foods like pasta or stew. Use beans or vegetables instead of meat in common dishes like chili or lasagna. Experiment with different cooking methods. Try roasting, broiling, steaming, and sauting vegetables. Add frozen vegetables to soups, stews, pasta, or rice. Add nuts or seeds for added healthy fats and plant protein at each meal. You can add these to yogurt, salads, or vegetable dishes. Marinate fish or vegetables using olive oil, lemon juice, garlic, and fresh herbs. Meal planning Plan to eat one vegetarian meal one day each week. Try to work up to two vegetarian meals, if possible. Eat seafood two or more times a week. Have healthy snacks readily available, such as: Vegetable sticks with hummus. Greek yogurt. Fruit and nut trail mix. Eat balanced meals throughout the week. This includes: Fruit: 2-3 servings a day. Vegetables: 4-5 servings a day. Low-fat dairy: 2 servings a day. Fish, poultry, or lean meat: 1 serving a day. Beans and legumes: 2 or more servings a week. Nuts and seeds: 1-2 servings a day. Whole grains: 6-8 servings a day. Extra-virgin olive oil: 3-4 servings a day. Limit red meat and sweets to only a few servings a month. Lifestyle  Cook and eat meals together with your family, when possible. Drink enough fluid to keep your urine pale yellow. Be physically active every day. This includes: Aerobic exercise like running or swimming. Leisure activities like gardening, walking, or housework. Get 7-8 hours of sleep each night. If recommended by your health care provider, drink red wine in moderation. This means 1 glass a day for nonpregnant women and 2 glasses a day for men. A glass of wine equals 5 oz (150 mL). What foods should I eat? Fruits Apples. Apricots. Avocado. Berries. Bananas. Cherries. Dates. Figs. Grapes. Lemons. Melon. Oranges. Peaches. Plums. Pomegranate. Vegetables Artichokes. Beets. Broccoli. Cabbage. Carrots.  Eggplant. Green beans. Chard. Kale. Spinach. Onions. Leeks. Peas. Squash. Tomatoes. Peppers. Radishes. Grains Whole-grain pasta. Brown rice. Bulgur wheat. Polenta. Couscous. Whole-wheat bread. [place]. Meats and other proteins Beans. Almonds. Sunflower seeds. Pine nuts. Peanuts. [place]. Salmon. Scallops. Shrimp. [place]. Tilapia. Clams. Oysters. Eggs. Poultry without skin. Dairy Low-fat milk. Cheese. Greek yogurt. Fats and oils Extra-virgin olive oil. Avocado oil. Grapeseed oil. Beverages Water. Red wine. Herbal tea. Sweets and desserts Greek yogurt with honey. Baked apples. Poached pears. Trail mix. Seasonings and condiments Basil. Cilantro. Coriander. Cumin. Mint. Parsley. Sage. Rosemary. Tarragon. Garlic. Oregano. Thyme. Pepper. Balsamic vinegar. Tahini. Hummus. Tomato sauce. Olives. Mushrooms. The items listed above may not be a complete list of foods and beverages you can eat. Contact a dietitian for more information. What foods should I limit? This is a list of foods that should be eaten rarely or only on special occasions. Fruits Fruit canned in syrup. Vegetables Deep-fried potatoes (french fries). Grains Prepackaged pasta or rice dishes. Prepackaged cereal with added sugar. Prepackaged snacks with added sugar. Meats and other proteins Beef. Pork. Lamb. Poultry with skin. Hot dogs. [place] Salines. Dairy Ice cream. Sour cream. Whole milk. Fats and oils Butter. Canola oil. Vegetable oil. Beef fat (tallow). Lard. Beverages Juice. Sugar-sweetened soft drinks. Beer. Liquor and spirits. Sweets and desserts Cookies. Cakes. Pies. Candy. Seasonings and condiments  Mayonnaise. Pre-made sauces and marinades. The items listed above may not be a complete list of foods and beverages you should limit. Contact a dietitian for more information. Summary The Mediterranean diet includes both food and lifestyle choices. Eat a variety of fresh fruits and vegetables, beans, nuts, seeds, and whole  grains. Limit the amount of red meat and sweets that you eat. If recommended by your health care provider, drink red wine in moderation. This means 1 glass a day for nonpregnant women and 2 glasses a day for men. A glass of wine equals 5 oz (150 mL). This information is not intended to replace advice given to you by your health care provider. Make sure you discuss any questions you have with your health care provider. Document Revised: 12/14/2019 Document Reviewed: 10/11/2019 Elsevier Patient Education  [place]

## 2022-04-22 ENCOUNTER — Encounter: Payer: Self-pay | Admitting: Internal Medicine

## 2022-04-22 ENCOUNTER — Ambulatory Visit (INDEPENDENT_AMBULATORY_CARE_PROVIDER_SITE_OTHER): Payer: Medicare Other | Admitting: Internal Medicine

## 2022-04-22 DIAGNOSIS — L219 Seborrheic dermatitis, unspecified: Secondary | ICD-10-CM

## 2022-04-22 DIAGNOSIS — R55 Syncope and collapse: Secondary | ICD-10-CM | POA: Diagnosis not present

## 2022-04-22 DIAGNOSIS — S022XXD Fracture of nasal bones, subsequent encounter for fracture with routine healing: Secondary | ICD-10-CM

## 2022-04-22 NOTE — Assessment & Plan Note (Signed)
Orthostatic - vaso-vagal S/p Card eval ECHO is pending Hydrate well

## 2022-04-22 NOTE — Assessment & Plan Note (Signed)
Use Cortaid cream prn

## 2022-04-22 NOTE — Assessment & Plan Note (Signed)
S/p ENT eval - doing well

## 2022-04-22 NOTE — Progress Notes (Addendum)
Subjective:  Patient ID: Shawn Orozco, male    DOB: Feb 24, 1946  Age: 76 y.o. MRN: [id number]  CC: No chief complaint on file.   HPI Shawn Orozco presents for syncope f/u - no relapse  Outpatient Medications Prior to Visit  Medication Sig Dispense Refill   aspirin EC 81 MG tablet Take 1 tablet (81 mg total) by mouth daily. 100 tablet 3   benzonatate (TESSALON) 200 MG capsule Take 1 capsule (200 mg total) by mouth 2 (two) times daily as needed for cough. 20 capsule 0   Hypertonic Nasal Wash (SINUS RINSE) PACK Place into the nose daily.     ketoconazole (NIZORAL) 2 % cream APPLY A SMALL AMOUNT TOPICALLY BETWEEN THE TOES AND TO AFFECTED AREA(S) TWO TIMES A DAY AS NEEDED     loratadine (CLARITIN) 10 MG tablet Take 1 tablet (10 mg total) by mouth daily. 30 tablet 11   Niacinamide 500 MG TBCR Take 500 mg by mouth daily.     omeprazole (PRILOSEC) 20 MG capsule Take 1 capsule (20 mg total) by mouth daily. 90 capsule 0   rosuvastatin (CRESTOR) 10 MG tablet Take 1 tablet (10 mg total) by mouth daily. 90 tablet 3   Vitamin D, Ergocalciferol, (DRISDOL) 1.25 MG (50000 UNIT) CAPS capsule Take 1 capsule (50,000 Units total) by mouth every 7 (seven) days. 12 capsule 0   Cholecalciferol (VITAMIN D3) 50 MCG (2000 UT) capsule Take 1 capsule (2,000 Units total) by mouth daily. (Patient not taking: Reported on 04/08/2022) 100 capsule 3   No facility-administered medications prior to visit.    ROS: Review of Systems  Constitutional:  Negative for appetite change, fatigue and unexpected weight change.  HENT:  Negative for congestion, nosebleeds, sneezing, sore throat and trouble swallowing.   Eyes:  Negative for itching and visual disturbance.  Respiratory:  Negative for cough.   Cardiovascular:  Negative for chest pain, palpitations and leg swelling.  Gastrointestinal:  Negative for abdominal distention, blood in stool, diarrhea and nausea.  Genitourinary:  Negative for frequency and hematuria.   Musculoskeletal:  Negative for back pain, gait problem, joint swelling and neck pain.  Skin:  Negative for rash.  Neurological:  Negative for dizziness, tremors, syncope, speech difficulty and weakness.  Psychiatric/Behavioral:  Negative for agitation, dysphoric mood and sleep disturbance. The patient is not nervous/anxious.    Objective:  BP 108/62 (BP Location: Left Arm, Patient Position: Sitting, Cuff Size: Normal)   Pulse 71   Temp 98.7 F (37.1 C) (Oral)   Ht 6' (1.829 m)   Wt 194 lb (88 kg)   SpO2 97%   BMI 26.31 kg/m   BP Readings from Last 3 Encounters:  04/22/22 108/62  04/08/22 122/72  03/29/22 138/78    Wt Readings from Last 3 Encounters:  04/22/22 194 lb (88 kg)  04/08/22 196 lb (88.9 kg)  03/29/22 195 lb (88.5 kg)    Physical Exam Constitutional:      General: He is not in acute distress.    Appearance: Normal appearance. He is well-developed.     Comments: NAD  Eyes:     Conjunctiva/sclera: Conjunctivae normal.     Pupils: Pupils are equal, round, and reactive to light.  Neck:     Thyroid: No thyromegaly.     Vascular: No JVD.  Cardiovascular:     Rate and Rhythm: Normal rate and regular rhythm.     Heart sounds: Normal heart sounds. No murmur heard.   No friction rub. No gallop.  Pulmonary:     Effort: Pulmonary effort is normal. No respiratory distress.     Breath sounds: Normal breath sounds. No wheezing or rales.  Chest:     Chest wall: No tenderness.  Abdominal:     General: Bowel sounds are normal. There is no distension.     Palpations: Abdomen is soft. There is no mass.     Tenderness: There is no abdominal tenderness. There is no guarding or rebound.  Musculoskeletal:        General: No tenderness. Normal range of motion.     Cervical back: Normal range of motion.  Lymphadenopathy:     Cervical: No cervical adenopathy.  Skin:    General: Skin is warm and dry.     Findings: No rash.  Neurological:     Mental Status: He is alert and  oriented to person, place, and time.     Cranial Nerves: No cranial nerve deficit.     Motor: No abnormal muscle tone.     Coordination: Coordination normal.     Gait: Gait normal.     Deep Tendon Reflexes: Reflexes are normal and symmetric.  Psychiatric:        Behavior: Behavior normal.        Thought Content: Thought content normal.        Judgment: Judgment normal.  Wound healed SD rash - eybrows  Lab Results  Component Value Date   WBC 7.6 03/22/2022   HGB 15.7 03/22/2022   HCT 44.2 03/22/2022   PLT 192 03/22/2022   GLUCOSE 151 (H) 03/22/2022   CHOL 140 03/03/2022   TRIG 132.0 03/03/2022   HDL 47.00 03/03/2022   LDLCALC 67 03/03/2022   ALT 19 03/22/2022   AST 20 03/22/2022   NA 139 03/22/2022   K 3.7 03/22/2022   CL 108 03/22/2022   CREATININE 1.15 03/22/2022   BUN 20 03/22/2022   CO2 25 03/22/2022   TSH 2.30 03/03/2022    CT Head Wo Contrast  Result Date: 03/22/2022 CLINICAL DATA:  Syncopal episode getting out of bed this morning. Fall with facial laceration in the right eyebrow region. Nose bleed. EXAM: CT HEAD WITHOUT CONTRAST CT MAXILLOFACIAL WITHOUT CONTRAST TECHNIQUE: Multidetector CT imaging of the head and maxillofacial structures were performed using the standard protocol without intravenous contrast. Multiplanar CT image reconstructions of the maxillofacial structures were also generated. RADIATION DOSE REDUCTION: This exam was performed according to the departmental dose-optimization program which includes automated exposure control, adjustment of the mA and/or kV according to patient size and/or use of iterative reconstruction technique. COMPARISON:  None. FINDINGS: CT HEAD FINDINGS Brain: There is no evidence for acute hemorrhage, hydrocephalus, mass lesion, or abnormal extra-axial fluid collection. No definite CT evidence for acute infarction. Vascular: No hyperdense vessel or unexpected calcification. Skull: No evidence for fracture. No worrisome lytic or  sclerotic lesion. Other: None. CT MAXILLOFACIAL FINDINGS Osseous: Minimally displaced nasal bone fractures evident. No other acute facial bone fracture evident. No mandibular fracture or dislocation. No destructive process. Orbits: Negative. No traumatic or inflammatory finding. Sinuses: Surgical changes are noted in the maxillary sinuses bilaterally chronic mucosal disease is seen in the ethmoid and frontal sinuses. Air-fluid level noted left sphenoid sinus and left maxillary sinus. Soft tissues: Edema and skin irregularity in the right supraorbital scalp is compatible with the reported history of laceration. IMPRESSION: 1. No acute intracranial abnormality. 2. Minimally displaced nasal bone fractures. No other maxillofacial fracture on the current study. 3. Chronic paranasal sinus disease  with air-fluid levels in the left sphenoid and left maxillary sinuses. Air-fluid levels may be related to the reported history of nose bleed. Electronically Signed   By: Misty Stanley M.D.   On: 03/22/2022 07:48   DG Chest Portable 1 View  Result Date: 03/22/2022 CLINICAL DATA:  Syncope this morning EXAM: PORTABLE CHEST 1 VIEW COMPARISON:  02/01/2022 FINDINGS: Normal heart size. Negative mediastinal contours. There is distortion from rightward rotation. Borderline atelectasis at the bases. There is no edema, consolidation, effusion, or pneumothorax. IMPRESSION: No evidence of active disease. Electronically Signed   By: Jorje Guild M.D.   On: 03/22/2022 07:36   CT Maxillofacial Wo Contrast  Result Date: 03/22/2022 CLINICAL DATA:  Syncopal episode getting out of bed this morning. Fall with facial laceration in the right eyebrow region. Nose bleed. EXAM: CT HEAD WITHOUT CONTRAST CT MAXILLOFACIAL WITHOUT CONTRAST TECHNIQUE: Multidetector CT imaging of the head and maxillofacial structures were performed using the standard protocol without intravenous contrast. Multiplanar CT image reconstructions of the maxillofacial  structures were also generated. RADIATION DOSE REDUCTION: This exam was performed according to the departmental dose-optimization program which includes automated exposure control, adjustment of the mA and/or kV according to patient size and/or use of iterative reconstruction technique. COMPARISON:  None. FINDINGS: CT HEAD FINDINGS Brain: There is no evidence for acute hemorrhage, hydrocephalus, mass lesion, or abnormal extra-axial fluid collection. No definite CT evidence for acute infarction. Vascular: No hyperdense vessel or unexpected calcification. Skull: No evidence for fracture. No worrisome lytic or sclerotic lesion. Other: None. CT MAXILLOFACIAL FINDINGS Osseous: Minimally displaced nasal bone fractures evident. No other acute facial bone fracture evident. No mandibular fracture or dislocation. No destructive process. Orbits: Negative. No traumatic or inflammatory finding. Sinuses: Surgical changes are noted in the maxillary sinuses bilaterally chronic mucosal disease is seen in the ethmoid and frontal sinuses. Air-fluid level noted left sphenoid sinus and left maxillary sinus. Soft tissues: Edema and skin irregularity in the right supraorbital scalp is compatible with the reported history of laceration. IMPRESSION: 1. No acute intracranial abnormality. 2. Minimally displaced nasal bone fractures. No other maxillofacial fracture on the current study. 3. 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 Air-fluid levels may be related to the reported history of nose bleed. Electronically Signed   By: Misty Stanley M.D.   On: 03/22/2022 07:48    Assessment & Plan:   Problem List Items Addressed This Visit     Fractured nose    S/p ENT eval - doing well        Seborrheic dermatitis    Use Cortaid cream prn       Syncope    Orthostatic - vaso-vagal S/p Card eval ECHO is pending Hydrate well         No orders of the defined types  were placed in this encounter.     Follow-up: No follow-ups on file.  Walker Kehr, MD

## 2022-04-23 NOTE — Progress Notes (Signed)
Shawn Orozco [address] [place] Phone: [phone] Subjective:   IVilma Meckel, am serving as a scribe for Dr. Hulan Saas.  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  In addition of this patient though does have an abnormality of the proximal vertebral body seems to be right greater than left that is concerning for the possibility of a small fracture.  We discussed with patient at this time about even advanced imaging with patient having a history of prostate cancer but patient would like to try conservative therapy initially.  We discussed icing regimen and home exercises, which activities to do which ones to avoid, increase activity slowly otherwise.  Patient knows if worsening pain to will come back sooner or seek medical attention.  Vitamin D supplementation prescribed follow-up with me again in 3 weeks  Update 04/26/2022 Shawn Orozco is a 76 y.o. male coming in with complaint of low back pain. Patient states back pain is gone. No other complaints patient states that he is doing much better.  He does have some difficulty with certain movements of the exercises but nothing that is stopping him from activity.  Patient denies any radiation of pain.  Not take any pain medications for the pain.  Able to sleep comfortably.  Denies any fevers, chills, any abnormal weight loss.       Past Medical History:  Diagnosis Date   Arthritis    Cataract    removed bilat 2014 with lens implants    Colon polyps    Deviated septum    GERD (gastroesophageal reflux disease)    Hyperlipidemia    preventative for FH    Prostate cancer ([place]) 2021   04-14-2020  prostate surgery    Past Surgical History:  Procedure Laterality Date   CATARACT EXTRACTION, BILATERAL     COLONOSCOPY     KNEE ARTHROSCOPY Right    NASAL SEPTUM SURGERY     POLYPECTOMY     PROSTATECTOMY  04/14/2020   SHOULDER ARTHROSCOPY Right    TONSILLECTOMY     Social History   Socioeconomic History   Marital status: Divorced    Spouse name: Not on file   Number of children: 0   Years of education: Not on file   Highest education level: Not on file  Occupational History   Occupation: retired  Tobacco Use   Smoking status: Never   Smokeless tobacco: Never  Vaping Use   Vaping Use: Never used  Substance and Sexual Activity   Alcohol use: Never   Drug use: Never   Sexual activity: Not Currently  Other Topics Concern   Not on file  Social History Narrative   Not on file   Social Determinants of Health   Financial Resource Strain: Low Risk    Difficulty of Paying Living Expenses: Not hard at all  Food Insecurity: No Food Insecurity   Worried About Charity fundraiser in the Last Year: Never true   [place] in the Last Year: Never true  Transportation Needs: No Transportation Needs   Lack of Transportation (Medical): No   Lack of Transportation (Non-Medical): No  Physical Activity: Inactive   Days of Exercise per Week: 0 days   Minutes of Exercise per Session: 0 min  Stress: No Stress Concern Present   Feeling of Stress : Not at all  Social Connections: Moderately Integrated   Frequency of Communication with Friends and Family: More than three times a week   Frequency of Social Gatherings with Friends and Family: More than three times a week   Attends Religious Services: More than 4 times per year   Active Member of [employer] or Organizations: Yes   Attends Music therapist: More than 4 times per year   Marital Status: Never married   No Known Allergies Family History  Problem Relation Age of Onset   Alzheimer's disease Mother     Alzheimer's disease Brother    Heart disease Brother    Heart disease Brother    Colon cancer Neg Hx    Colon polyps Neg Hx    Esophageal cancer Neg Hx    Rectal cancer Neg Hx    Stomach cancer Neg Hx      Current Outpatient Medications (Cardiovascular):    rosuvastatin (CRESTOR) 10 MG tablet, Take 1 tablet (10 mg total) by mouth daily.  Current Outpatient Medications (Respiratory):    benzonatate (TESSALON) 200 MG capsule, Take 1 capsule (200 mg total) by mouth 2 (two) times daily as needed for cough.   Hypertonic Nasal Wash (SINUS RINSE) PACK, Place into the nose daily.   loratadine (CLARITIN) 10 MG tablet, Take 1 tablet (10 mg total) by mouth daily.  Current Outpatient Medications (Analgesics):    aspirin EC 81 MG tablet, Take 1 tablet (81 mg total) by mouth daily.   Current Outpatient Medications (Other):    ketoconazole (NIZORAL) 2 % cream, APPLY A SMALL AMOUNT TOPICALLY BETWEEN THE TOES AND TO AFFECTED AREA(S) TWO TIMES A DAY AS NEEDED   Niacinamide 500 MG TBCR, Take 500 mg by mouth daily.   omeprazole (PRILOSEC) 20 MG capsule, Take 1 capsule (20 mg total) by mouth daily.   Vitamin D, Ergocalciferol, (DRISDOL) 1.25 MG (50000 UNIT) CAPS capsule, Take 1 capsule (50,000 Units total) by mouth every 7 (seven) days.   Objective  Blood pressure 132/76, pulse 67, height 6' (1.829 m), weight 197 lb (89.4 kg), SpO2 95 %.   General: No apparent distress alert and oriented x3 mood and affect normal, dressed appropriately.  HEENT: Pupils equal, extraocular movements intact  Respiratory: Patient's speak in full sentences and does not appear short of breath  Cardiovascular: No lower extremity edema, non tender, no erythema  Still have some stiffness noted in the lower back.  Patient lacks the last 10 degrees of extension.  Has significant tightness with straight leg test bilaterally.  Negative Faber.  Tightness noted with FABER noted on the right greater than left.    Impression and  Recommendations:     The above documentation has been reviewed and is accurate and complete Lyndal Pulley, DO

## 2022-04-26 ENCOUNTER — Ambulatory Visit (INDEPENDENT_AMBULATORY_CARE_PROVIDER_SITE_OTHER): Payer: Medicare Other | Admitting: Family Medicine

## 2022-04-26 DIAGNOSIS — M545 Low back pain, unspecified: Secondary | ICD-10-CM

## 2022-04-26 NOTE — Assessment & Plan Note (Signed)
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  I do think that there is some mild instability that could be contributing the patient's pain is completely resolved at the moment with home exercises.  We will continue to monitor and if any worsening symptoms I believe patient will do well with conservative therapy and follow-up with me again in 3 months

## 2022-04-26 NOTE — Patient Instructions (Signed)
So glad you're doing so much better Keep dong exercises 2x a week but listen to body See you again in 8 weeks

## 2022-04-28 ENCOUNTER — Ambulatory Visit (HOSPITAL_COMMUNITY): Payer: Medicare Other | Attending: Cardiovascular Disease

## 2022-04-28 DIAGNOSIS — R55 Syncope and collapse: Secondary | ICD-10-CM | POA: Diagnosis present

## 2022-04-28 LAB — ECHOCARDIOGRAM COMPLETE
Area-P 1/2: 2.83 cm2
S' Lateral: 2.1 cm

## 2022-05-17 ENCOUNTER — Other Ambulatory Visit: Payer: Self-pay | Admitting: Internal Medicine

## 2022-05-24 ENCOUNTER — Telehealth: Payer: Self-pay | Admitting: Gastroenterology

## 2022-05-24 MED ORDER — OMEPRAZOLE 20 MG PO CPDR: 20.0000 mg | DELAYED_RELEASE_CAPSULE | Freq: Every day | ORAL | 0 refills | Status: DC

## 2022-05-24 NOTE — Telephone Encounter (Signed)
Inbound call from patient stating that he needed to make an appointment to get a refill for Prilosec. Patient was scheduled for 8/16 at 8:30 and is requesting that refill be sent to pharmacy. Please advise.

## 2022-05-24 NOTE — Telephone Encounter (Signed)
Script sent to pharmacy.

## 2022-06-23 NOTE — Progress Notes (Deleted)
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  I do think that there is some mild instability that could be contributing the patient's pain is completely resolved at the moment with home exercises.  We will continue to monitor and if any worsening symptoms I believe patient will do well with conservative therapy and follow-up with me again in 3 months  Update 06/29/2022 Shawn Orozco is a 76 y.o. male coming in with complaint of lumbar spine pain. Patient states       Past Medical History:  Diagnosis Date   Arthritis    Cataract    removed bilat 2014 with lens implants    Colon polyps    Deviated septum    GERD (gastroesophageal reflux disease)    Hyperlipidemia    preventative for FH    Prostate cancer ([place]) 2021   04-14-2020 prostate surgery    Past Surgical History:  Procedure Laterality Date   CATARACT EXTRACTION, BILATERAL     COLONOSCOPY     KNEE ARTHROSCOPY Right    NASAL SEPTUM SURGERY     POLYPECTOMY     PROSTATECTOMY  04/14/2020   SHOULDER ARTHROSCOPY Right    TONSILLECTOMY     Social History   Socioeconomic History   Marital status: Divorced    Spouse name: Not on file   Number of children: 0   Years of education: Not on file   Highest education level: Not on file  Occupational History   Occupation: retired  Tobacco Use   Smoking status: Never   Smokeless tobacco: Never  Vaping Use   Vaping Use: Never used  Substance and Sexual Activity   Alcohol use: Never   Drug use: Never   Sexual activity: Not Currently  Other Topics Concern   Not on file  Social History Narrative   Not on file   Social Determinants of Health   Financial Resource Strain: Low Risk  (08/03/2021)   Overall Financial Resource  Strain (CARDIA)    Difficulty of Paying Living Expenses: Not hard at all  Food Insecurity: No Food Insecurity (08/03/2021)   Hunger Vital Sign    Worried About Running Out of Food in the Last Year: Never true    Ran Out of Food in the Last Year: Never true  Transportation Needs: No Transportation Needs (08/03/2021)   PRAPARE - Hydrologist (Medical): No    Lack of Transportation (Non-Medical): No  Physical Activity: Inactive (08/03/2021)   Exercise Vital Sign    Days of Exercise per Week: 0 days    Minutes of Exercise per Session: 0 min  Stress: No Stress Concern Present (08/03/2021)   [place]    Feeling of Stress : Not at all  Social Connections: Moderately Integrated (08/03/2021)   Social Connection and Isolation Panel [NHANES]    Frequency of Communication with Friends and Family: More than three times a week    Frequency of Social Gatherings with Friends and Family: More than three times a week    Attends Religious Services: More than 4 times per year    Active Member of [employer] or Organizations: Yes    Attends Archivist  Meetings: More than 4 times per year    Marital Status: Never married   No Known Allergies Family History  Problem Relation Age of Onset   Alzheimer's disease Mother    Alzheimer's disease Brother    Heart disease Brother    Heart disease Brother    Colon cancer Neg Hx    Colon polyps Neg Hx    Esophageal cancer Neg Hx    Rectal cancer Neg Hx    Stomach cancer Neg Hx      Current Outpatient Medications (Cardiovascular):    rosuvastatin (CRESTOR) 10 MG tablet, TAKE ONE TABLET BY MOUTH ONE TIME DAILY  Current Outpatient Medications (Respiratory):    benzonatate (TESSALON) 200 MG capsule, Take 1 capsule (200 mg total) by mouth 2 (two) times daily as needed for cough.   Hypertonic Nasal Wash (SINUS RINSE) PACK, Place into the nose daily.   loratadine  (CLARITIN) 10 MG tablet, Take 1 tablet (10 mg total) by mouth daily.  Current Outpatient Medications (Analgesics):    aspirin EC 81 MG tablet, Take 1 tablet (81 mg total) by mouth daily.   Current Outpatient Medications (Other):    ketoconazole (NIZORAL) 2 % cream, APPLY A SMALL AMOUNT TOPICALLY BETWEEN THE TOES AND TO AFFECTED AREA(S) TWO TIMES A DAY AS NEEDED   Niacinamide 500 MG TBCR, Take 500 mg by mouth daily.   omeprazole (PRILOSEC) 20 MG capsule, Take 1 capsule (20 mg total) by mouth daily.   Vitamin D, Ergocalciferol, (DRISDOL) 1.25 MG (50000 UNIT) CAPS capsule, Take 1 capsule (50,000 Units total) by mouth every 7 (seven) days.   Reviewed prior external information including notes and imaging from  primary care provider As well as notes that were available from care everywhere and other healthcare systems.  Past medical history, social, surgical and family history all reviewed in electronic medical record.  No pertanent information unless stated regarding to the chief complaint.   Review of Systems:  No headache, visual changes, nausea, vomiting, diarrhea, constipation, dizziness, abdominal pain, skin rash, fevers, chills, night sweats, weight loss, swollen lymph nodes, body aches, joint swelling, chest pain, shortness of breath, mood changes. POSITIVE muscle aches  Objective  There were no vitals taken for this visit.   General: No apparent distress alert and oriented x3 mood and affect normal, dressed appropriately.  HEENT: Pupils equal, extraocular movements intact  Respiratory: Patient's speak in full sentences and does not appear short of breath  Cardiovascular: No lower extremity edema, non tender, no erythema      Impression and Recommendations:

## 2022-06-29 ENCOUNTER — Ambulatory Visit: Payer: Medicare Other | Admitting: Family Medicine

## 2022-07-07 ENCOUNTER — Ambulatory Visit (INDEPENDENT_AMBULATORY_CARE_PROVIDER_SITE_OTHER): Payer: Medicare Other | Admitting: Gastroenterology

## 2022-07-07 ENCOUNTER — Encounter: Payer: Self-pay | Admitting: Gastroenterology

## 2022-07-07 VITALS — BP 122/68 | HR 60 | Ht 72.0 in | Wt 194.4 lb

## 2022-07-07 DIAGNOSIS — K219 Gastro-esophageal reflux disease without esophagitis: Secondary | ICD-10-CM

## 2022-07-07 DIAGNOSIS — R142 Eructation: Secondary | ICD-10-CM | POA: Diagnosis not present

## 2022-07-07 DIAGNOSIS — R1013 Epigastric pain: Secondary | ICD-10-CM | POA: Insufficient documentation

## 2022-07-07 MED ORDER — OMEPRAZOLE 20 MG PO CPDR: 20.0000 mg | DELAYED_RELEASE_CAPSULE | Freq: Every day | ORAL | 3 refills | Status: DC

## 2022-07-07 NOTE — Patient Instructions (Signed)
If you are age 77 or older, your body mass index should be between 23-30. Your Body mass index is 26.37 kg/m. If this is out of the aforementioned range listed, please consider follow up with your Primary Care Provider. ________________________________________________________  The [facility] providers would like to encourage you to use [facility] to communicate with providers for non-urgent requests or questions.  Due to long hold times on the telephone, sending your provider a message by [facility] may be a faster and more efficient way to get a response.  Please allow 48 business hours for a response.  Please remember that this is for non-urgent requests.  _______________________________________________________   Refills of your Omeprazole 20 mg have been sent to your pharmacy   You may also try adding a dose of OTC Pepcid.   Follow up in 1 year or sooner as needed.

## 2022-07-07 NOTE — Progress Notes (Signed)
07/07/2022 Shawn Orozco [id number] May 23, 1946   HISTORY OF PRESENT ILLNESS: This is a pleasant 76 year old male who is a patient of Dr. Doyne Keel.  He is here today for yearly follow-up and to get refills on his omeprazole.  He is on omeprazole 20 mg daily for acid reflux.  He says that he feels like his acid reflux itself is well controlled, but he has noticed some increase in belching recently and some upper abdominal discomfort.  Not pain per se, does not really hurt when he presses on it, just uncomfortable.  He was asking about a hernia.  He has not lost any weight, in fact he has gained a little bit of weight.  Pain is not necessarily worsened by eating or anything in particular.  No nausea or vomiting.  EGD 02/2021:  - Esophagogastric landmarks identified. - 1 cm hiatal hernia. - Normal esophagus otherwise - no evidence of Barrett's or esophagitis on omeprazole - Multiple benign appearing gastric polyps, likely fundic gland polyps. Biopsied. - Gastritis of the antrum. Normal stomach otherwise - biopsies taken to rule out H pylori - Erythematous duodenopathy. Biopsied.  Colonoscopy 02/2021:  - One 4 mm polyp in the ascending colon, removed with a cold snare. Resected and retrieved. - One 3 mm polyp in the rectum, removed with a cold snare. Resected and retrieved. - Diverticulosis in the sigmoid colon. - Internal hemorrhoids. - The examination was otherwise normal.   1. Surgical [P], random duodenal sites - UNREMARKABLE DUODENAL MUCOSA. - NO FEATURES OF CELIAC SPRUE OR GRANULOMAS. 2. Surgical [P], gastric antrum and gastric body - GASTRIC ANTRAL MUCOSA WITH MILD CHRONIC INFLAMMATION AND HYPEREMIA SUGGESTIVE OF EROSION. - GASTRIC OXYNTIC MUCOSA WITH HYPEREMIA. - WARTHIN-STARRY NEGATIVE FOR HELICOBACTER PYLORI. - NO INTESTINAL METAPLASIA, DYSPLASIA OR CARCINOMA. 3. Surgical [P], gastric body polyp - FUNDIC GLAND POLYP. - NO INTESTINAL METAPLASIA, ADENOMATOUS CHANGE OR  CARCINOMA. 4. Surgical [P], colon, rectum, polyp (1) - POLYPOID COLONIC MUCOSA WITH HEMORRHAGE. - NO ADENOMATOUS CHANGE OR CARCINOMA. 5. Surgical [P], colon, ascending, polyp (1) - TUBULAR ADENOMA. - NO HIGH GRADE DYSPLASIA OR CARCINOMA.   Past Medical History:  Diagnosis Date   Arthritis    Cataract    removed bilat 2014 with lens implants    Colon polyps    Deviated septum    GERD (gastroesophageal reflux disease)    Hyperlipidemia    preventative for FH    Prostate cancer ([place]) 2021   04-14-2020 prostate surgery    Past Surgical History:  Procedure Laterality Date   CATARACT EXTRACTION, BILATERAL     COLONOSCOPY     KNEE ARTHROSCOPY Right    NASAL SEPTUM SURGERY     POLYPECTOMY     PROSTATECTOMY  04/14/2020   SHOULDER ARTHROSCOPY Right    TONSILLECTOMY      reports that he has never smoked. He has never used smokeless tobacco. He reports that he does not drink alcohol and does not use drugs. family history includes Alzheimer's disease in his brother and mother; Heart disease in his brother and brother. No Known Allergies    Outpatient Encounter Medications as of 07/07/2022  Medication Sig   aspirin EC 81 MG tablet Take 1 tablet (81 mg total) by mouth daily.   benzonatate (TESSALON) 200 MG capsule Take 1 capsule (200 mg total) by mouth 2 (two) times daily as needed for cough.   cholecalciferol (VITAMIN D3) 25 MCG (1000 UNIT) tablet    Hypertonic Nasal Wash (SINUS RINSE) PACK Place  into the nose daily.   ketoconazole (NIZORAL) 2 % cream APPLY A SMALL AMOUNT TOPICALLY BETWEEN THE TOES AND TO AFFECTED AREA(S) TWO TIMES A DAY AS NEEDED   loratadine (CLARITIN) 10 MG tablet Take 1 tablet (10 mg total) by mouth daily.   Niacinamide 500 MG TBCR Take 500 mg by mouth daily.   omeprazole (PRILOSEC) 20 MG capsule Take 1 capsule (20 mg total) by mouth daily.   rosuvastatin (CRESTOR) 10 MG tablet TAKE ONE TABLET BY MOUTH ONE TIME DAILY   Vitamin D, Ergocalciferol, (DRISDOL) 1.25 MG  (50000 UNIT) CAPS capsule Take 1 capsule (50,000 Units total) by mouth every 7 (seven) days. (Patient not taking: Reported on 07/07/2022)   No facility-administered encounter medications on file as of 07/07/2022.     REVIEW OF SYSTEMS  : All other systems reviewed and negative except where noted in the History of Present Illness.  PHYSICAL EXAM: BP 122/68   Pulse 60   Ht 6' (1.829 m)   Wt 194 lb 6.4 oz (88.2 kg)   SpO2 96%   BMI 26.37 kg/m  General: Well developed white male in no acute distress Head: Normocephalic and atraumatic Eyes:  Sclerae anicteric, conjunctiva pink. Ears: Normal auditory acuity Lungs: Clear throughout to auscultation; no W/R/R. Heart: Regular rate and rhythm; no M/R/G. Abdomen: Soft, non-distended.  BS present.  Non-tender.  Ventral abdominal wall hernia noted without tenderness. Musculoskeletal: Symmetrical with no gross deformities  Skin: No lesions on visible extremities Extremities: No edema  Neurological: Alert oriented x 4, grossly non-focal Psychological:  Alert and cooperative. Normal mood and affect  ASSESSMENT AND PLAN: *GERD with belching and epigastric abdominal pain: He feels like his reflux is well controlled on omeprazole 20 mg daily, but complains of belching and some upper abdominal discomfort.  He is asking about a hernia.  He does have a 1 cm hiatal hernia that was seen on EGD last year.  On exam he has ventral abdominal wall hernia also, but I doubt that is causing him much symptoms.  He has no red flag symptoms.  Had EGD and colonoscopy both last year.  I recommended that he could increase the omeprazole to twice daily, but he declined.  Also suggested that he could try taking a dose of Pepcid later each day which he may try.  We will observe for now, but if his pain continues we could consider CT scan of the abdomen.  He says he has a appointment with Dr. Alain Marion in October and so if it is continued then he will talk to him about ordering a  CT scan then.  Obviously he will reach out if it worsens or other symptoms become present in the interim.  We will refill his omeprazole 20 mg daily with a 90-day supply.  Prescription sent to pharmacy.   CC:  Plotnikov, Evie Lacks, MD

## 2022-07-07 NOTE — Progress Notes (Signed)
Agree with assessment and plan as outlined.  

## 2022-08-04 ENCOUNTER — Ambulatory Visit (INDEPENDENT_AMBULATORY_CARE_PROVIDER_SITE_OTHER): Payer: Medicare Other

## 2022-08-04 VITALS — Ht 72.0 in | Wt 192.0 lb

## 2022-08-04 DIAGNOSIS — Z0000 Encounter for general adult medical examination without abnormal findings: Secondary | ICD-10-CM

## 2022-08-04 NOTE — Patient Instructions (Signed)
Shawn Orozco , Thank you for taking time to come for your Medicare Wellness Visit. I appreciate your ongoing commitment to your health goals. Please review the following plan we discussed and let me know if I can assist you in the future.   Screening recommendations/referrals: Colonoscopy: No longer recommended due to age Recommended yearly ophthalmology/optometry visit for glaucoma screening and checkup Recommended yearly dental visit for hygiene and checkup  Vaccinations: Influenza vaccine: due Pneumococcal vaccine: 12/15/2020 (Prevnar 13) Tdap vaccine: 03/22/2022; due every 10 years Shingles vaccine: due   Covid-19: 02/06/2020, 03/07/2020, 10/07/2020, 12/02/2021  Advanced directives: Yes; Please bring a copy of your health care power of attorney and living will to the office at your convenience.  Conditions/risks identified: Yes; Maintain my current health status by continuing to eat healthy, stay physically active and socially active.  Next appointment: Follow up in one year for your annual wellness visit.   Preventive Care 29 Years and Older, Male  Preventive care refers to lifestyle choices and visits with your health care provider that can promote health and wellness. What does preventive care include? A yearly physical exam. This is also called an annual well check. Dental exams once or twice a year. Routine eye exams. Ask your health care provider how often you should have your eyes checked. Personal lifestyle choices, including: Daily care of your teeth and gums. Regular physical activity. Eating a healthy diet. Avoiding tobacco and drug use. Limiting alcohol use. Practicing safe sex. Taking low doses of aspirin every day. Taking vitamin and mineral supplements as recommended by your health care provider. What happens during an annual well check? The services and screenings done by your health care provider during your annual well check will depend on your age, overall health,  lifestyle risk factors, and family history of disease. Counseling  Your health care provider may ask you questions about your: Alcohol use. Tobacco use. Drug use. Emotional well-being. Home and relationship well-being. Sexual activity. Eating habits. History of falls. Memory and ability to understand (cognition). Work and work Statistician. Screening  You may have the following tests or measurements: Height, weight, and BMI. Blood pressure. Lipid and cholesterol levels. These may be checked every 5 years, or more frequently if you are over 77 years old. Skin check. Lung cancer screening. You may have this screening every year starting at age 77 if you have a 30-pack-year history of smoking and currently smoke or have quit within the past 15 years. Fecal occult blood test (FOBT) of the stool. You may have this test every year starting at age 77. Flexible sigmoidoscopy or colonoscopy. You may have a sigmoidoscopy every 5 years or a colonoscopy every 10 years starting at age 77. Prostate cancer screening. Recommendations will vary depending on your family history and other risks. Hepatitis C blood test. Hepatitis B blood test. Sexually transmitted disease (STD) testing. Diabetes screening. This is done by checking your blood sugar (glucose) after you have not eaten for a while (fasting). You may have this done every 1-3 years. Abdominal aortic aneurysm (AAA) screening. You may need this if you are a current or former smoker. Osteoporosis. You may be screened starting at age 77 if you are at high risk. Talk with your health care provider about your test results, treatment options, and if necessary, the need for more tests. Vaccines  Your health care provider may recommend certain vaccines, such as: Influenza vaccine. This is recommended every year. Tetanus, diphtheria, and acellular pertussis (Tdap, Td) vaccine. You may  need a Td booster every 10 years. Zoster vaccine. You may need this  after age 77. Pneumococcal 13-valent conjugate (PCV13) vaccine. One dose is recommended after age 51. Pneumococcal polysaccharide (PPSV23) vaccine. One dose is recommended after age 74. Talk to your health care provider about which screenings and vaccines you need and how often you need them. This information is not intended to replace advice given to you by your health care provider. Make sure you discuss any questions you have with your health care provider. Document Released: 12/05/2015 Document Revised: 07/28/2016 Document Reviewed: 09/09/2015 Elsevier Interactive Patient Education  2017 [employer] Prevention in the Home Falls can cause injuries. They can happen to people of all ages. There are many things you can do to make your home safe and to help prevent falls. What can I do on the outside of my home? Regularly fix the edges of walkways and driveways and fix any cracks. Remove anything that might make you trip as you walk through a door, such as a raised step or threshold. Trim any bushes or trees on the path to your home. Use bright outdoor lighting. Clear any walking paths of anything that might make someone trip, such as rocks or tools. Regularly check to see if handrails are loose or broken. Make sure that both sides of any steps have handrails. Any raised decks and porches should have guardrails on the edges. Have any leaves, snow, or ice cleared regularly. Use sand or salt on walking paths during winter. Clean up any spills in your garage right away. This includes oil or grease spills. What can I do in the bathroom? Use night lights. Install grab bars by the toilet and in the tub and shower. Do not use towel bars as grab bars. Use non-skid mats or decals in the tub or shower. If you need to sit down in the shower, use a plastic, non-slip stool. Keep the floor dry. Clean up any water that spills on the floor as soon as it happens. Remove soap buildup in the tub or  shower regularly. Attach bath mats securely with double-sided non-slip rug tape. Do not have throw rugs and other things on the floor that can make you trip. What can I do in the bedroom? Use night lights. Make sure that you have a light by your bed that is easy to reach. Do not use any sheets or blankets that are too big for your bed. They should not hang down onto the floor. Have a firm chair that has side arms. You can use this for support while you get dressed. Do not have throw rugs and other things on the floor that can make you trip. What can I do in the kitchen? Clean up any spills right away. Avoid walking on wet floors. Keep items that you use a lot in easy-to-reach places. If you need to reach something above you, use a strong step stool that has a grab bar. Keep electrical cords out of the way. Do not use floor polish or wax that makes floors slippery. If you must use wax, use non-skid floor wax. Do not have throw rugs and other things on the floor that can make you trip. What can I do with my stairs? Do not leave any items on the stairs. Make sure that there are handrails on both sides of the stairs and use them. Fix handrails that are broken or loose. Make sure that handrails are as long as the stairways.  Check any carpeting to make sure that it is firmly attached to the stairs. Fix any carpet that is loose or worn. Avoid having throw rugs at the top or bottom of the stairs. If you do have throw rugs, attach them to the floor with carpet tape. Make sure that you have a light switch at the top of the stairs and the bottom of the stairs. If you do not have them, ask someone to add them for you. What else can I do to help prevent falls? Wear shoes that: Do not have high heels. Have rubber bottoms. Are comfortable and fit you well. Are closed at the toe. Do not wear sandals. If you use a stepladder: Make sure that it is fully opened. Do not climb a closed stepladder. Make  sure that both sides of the stepladder are locked into place. Ask someone to hold it for you, if possible. Clearly mark and make sure that you can see: Any grab bars or handrails. First and last steps. Where the edge of each step is. Use tools that help you move around (mobility aids) if they are needed. These include: Canes. Walkers. Scooters. Crutches. Turn on the lights when you go into a dark area. Replace any light bulbs as soon as they burn out. Set up your furniture so you have a clear path. Avoid moving your furniture around. If any of your floors are uneven, fix them. If there are any pets around you, be aware of where they are. Review your medicines with your doctor. Some medicines can make you feel dizzy. This can increase your chance of falling. Ask your doctor what other things that you can do to help prevent falls. This information is not intended to replace advice given to you by your health care provider. Make sure you discuss any questions you have with your health care provider. Document Released: 09/04/2009 Document Revised: 04/15/2016 Document Reviewed: 12/13/2014 Elsevier Interactive Patient Education  2017 [employer].

## 2022-08-04 NOTE — Progress Notes (Cosign Needed Addendum)
Subjective:   Shawn Orozco is a 76 y.o. male who presents for Medicare Annual/Subsequent preventive examination.  Review of Systems    Virtual Visit via Telephone Note  I connected with  Shawn Orozco on 08/04/22 at 10:45 AM EDT by telephone and verified that I am speaking with the correct person using two identifiers.  Location: Patient: HOME Provider: [facility] Persons participating in the virtual visit: patient/Nurse Health Advisor   I discussed the limitations, risks, security and privacy concerns of performing an evaluation and management service by telephone and the availability of in person appointments. The patient expressed understanding and agreed to proceed.  Interactive audio and video telecommunications were attempted between this nurse and patient, however failed, due to patient having technical difficulties OR patient did not have access to video capability.  We continued and completed visit with audio only.  Some vital signs may be absent or patient reported.   Sheral Flow, LPN  Cardiac Risk Factors include: advanced age (>42mn, >>27women);dyslipidemia;family history of premature cardiovascular disease;male gender     Objective:    Today's Vitals   08/04/22 1057  Weight: 192 lb (87.1 kg)  Height: 6' (1.829 m)  PainSc: 0-No pain   Body mass index is 26.04 kg/m.     08/04/2022   10:50 AM 03/22/2022    7:11 AM 08/03/2021    1:43 PM  Advanced Directives  Does Patient Have a Medical Advance Directive? Yes Yes Yes  Type of [place] Living will;Healthcare Power of Attorney  Does patient want to make changes to medical advance directive?   No - Patient declined  Copy of [place] Chart? No - copy requested  No - copy requested    Current Medications (verified) Outpatient Encounter Medications as of 08/04/2022  Medication Sig   aspirin  EC 81 MG tablet Take 1 tablet (81 mg total) by mouth daily.   benzonatate (TESSALON) 200 MG capsule Take 1 capsule (200 mg total) by mouth 2 (two) times daily as needed for cough.   cholecalciferol (VITAMIN D3) 25 MCG (1000 UNIT) tablet    Hypertonic Nasal Wash (SINUS RINSE) PACK Place into the nose daily.   ketoconazole (NIZORAL) 2 % cream APPLY A SMALL AMOUNT TOPICALLY BETWEEN THE TOES AND TO AFFECTED AREA(S) TWO TIMES A DAY AS NEEDED   loratadine (CLARITIN) 10 MG tablet Take 1 tablet (10 mg total) by mouth daily.   Niacinamide 500 MG TBCR Take 500 mg by mouth daily.   omeprazole (PRILOSEC) 20 MG capsule Take 1 capsule (20 mg total) by mouth daily.   rosuvastatin (CRESTOR) 10 MG tablet TAKE ONE TABLET BY MOUTH ONE TIME DAILY   [DISCONTINUED] Vitamin D, Ergocalciferol, (DRISDOL) 1.25 MG (50000 UNIT) CAPS capsule Take 1 capsule (50,000 Units total) by mouth every 7 (seven) days. (Patient not taking: Reported on 07/07/2022)   No facility-administered encounter medications on file as of 08/04/2022.    Allergies (verified) Patient has no known allergies.   History: Past Medical History:  Diagnosis Date   Arthritis    Cataract    removed bilat 2014 with lens implants    Colon polyps    Deviated septum    GERD (gastroesophageal reflux disease)    Hyperlipidemia    preventative for FH    Prostate cancer ([facility] 2021   04-14-2020 prostate surgery    Past Surgical History:  Procedure Laterality Date   CATARACT EXTRACTION,  BILATERAL     COLONOSCOPY     KNEE ARTHROSCOPY Right    NASAL SEPTUM SURGERY     POLYPECTOMY     PROSTATECTOMY  04/14/2020   SHOULDER ARTHROSCOPY Right    TONSILLECTOMY     Family History  Problem Relation Age of Onset   Alzheimer's disease Mother    Alzheimer's disease Brother    Heart disease Brother    Heart disease Brother    Colon cancer Neg Hx    Colon polyps Neg Hx    Esophageal cancer Neg Hx    Rectal cancer Neg Hx    Stomach cancer Neg Hx    Social  History   Socioeconomic History   Marital status: Divorced    Spouse name: Not on file   Number of children: 0   Years of education: Not on file   Highest education level: Not on file  Occupational History   Occupation: retired  Tobacco Use   Smoking status: Never   Smokeless tobacco: Never  Vaping Use   Vaping Use: Never used  Substance and Sexual Activity   Alcohol use: Never   Drug use: Never   Sexual activity: Not Currently  Other Topics Concern   Not on file  Social History Narrative   Not on file   Social Determinants of Health   Financial Resource Strain: Low Risk  (08/04/2022)   Overall Financial Resource Strain (CARDIA)    Difficulty of Paying Living Expenses: Not hard at all  Food Insecurity: No Food Insecurity (08/04/2022)   Hunger Vital Sign    Worried About Running Out of Food in the Last Year: Never true    Ran Out of Food in the Last Year: Never true  Transportation Needs: No Transportation Needs (08/04/2022)   PRAPARE - Hydrologist (Medical): No    Lack of Transportation (Non-Medical): No  Physical Activity: Sufficiently Active (08/04/2022)   Exercise Vital Sign    Days of Exercise per Week: 7 days    Minutes of Exercise per Session: 60 min  Stress: No Stress Concern Present (08/04/2022)   [place]    Feeling of Stress : Not at all  Social Connections: Moderately Integrated (08/04/2022)   Social Connection and Isolation Panel [NHANES]    Frequency of Communication with Friends and Family: More than three times a week    Frequency of Social Gatherings with Friends and Family: More than three times a week    Attends Religious Services: More than 4 times per year    Active Member of [employer] or Organizations: Yes    Attends Music therapist: More than 4 times per year    Marital Status: Divorced    Tobacco Counseling Counseling given: Not  Answered   Clinical Intake:  Pre-visit preparation completed: Yes  Pain : No/denies pain Pain Score: 0-No pain     BMI - recorded: 26.04 Nutritional Status: BMI 25 -29 Overweight Nutritional Risks: None Diabetes: No  How often do you need to have someone help you when you read instructions, pamphlets, or other written materials from your doctor or pharmacy?: 1 - Never What is the last grade level you completed in school?: HSG; some college; Military  Diabetic? NO  Interpreter Needed?: No  Information entered by :: Lisette Abu, LPN.   Activities of Daily Living    08/04/2022   11:00 AM  In your present state of health, do you  have any difficulty performing the following activities:  Hearing? 0  Vision? 0  Difficulty concentrating or making decisions? 0  Walking or climbing stairs? 0  Dressing or bathing? 0  Doing errands, shopping? 0  Preparing Food and eating ? N  Using the Toilet? N  In the past six months, have you accidently leaked urine? N  Do you have problems with loss of bowel control? N  Managing your Medications? N  Managing your Finances? N  Housekeeping or managing your Housekeeping? N    Patient Care Team: Plotnikov, Evie Lacks, MD as PCP - General (Internal Medicine) Werner Lean, MD as PCP - Cardiology (Cardiology) Remi Haggard, MD as Consulting Physician (Urology) Armbruster, Carlota Raspberry, MD as Consulting Physician (Gastroenterology) Werner Lean, MD as Consulting Physician (Cardiology) Marica Otter, OD as Consulting Physician (Optometry)  Indicate any recent Medical Services you may have received from other than [facility] providers in the past year (date may be approximate).     Assessment:   This is a routine wellness examination for [place].  Hearing/Vision screen Hearing Screening - Comments:: Denies hearing difficulties   Vision Screening - Comments:: Wears readers - up to date with routine eye exams with  Marica Otter, OD.   Dietary issues and exercise activities discussed: Current Exercise Habits: Home exercise routine, Type of exercise: walking, Time (Minutes): 60, Frequency (Times/Week): 7, Weekly Exercise (Minutes/Week): 420, Intensity: Moderate, Exercise limited by: None identified   Goals Addressed             This Visit's Progress    To maintain my current health status by continuing to eat healthy, stay physically active and socially active.        Depression Screen    08/04/2022   10:54 AM 02/01/2022    9:27 AM 08/03/2021    1:56 PM 12/15/2020    9:14 AM  PHQ 2/9 Scores  PHQ - 2 Score 0 0 0 0    Fall Risk    08/04/2022   10:51 AM 02/01/2022    9:27 AM 08/03/2021    1:44 PM 02/26/2021    9:28 AM  Fall Risk   Falls in the past year? 1 0 0 0  Number falls in past yr: 0 0 0 0  Injury with Fall? 1 0 0 0  Risk for fall due to :   No Fall Risks No Fall Risks  Follow up Falls prevention discussed  Falls evaluation completed     [place]:  Any stairs in or around the home? No  If so, are there any without handrails? No  Home free of loose throw rugs in walkways, pet beds, electrical cords, etc? Yes  Adequate lighting in your home to reduce risk of falls? Yes   ASSISTIVE DEVICES UTILIZED TO PREVENT FALLS:  Life alert? No  Use of a cane, walker or w/c? No  Grab bars in the bathroom? No  Shower chair or bench in shower? No  Elevated toilet seat or a handicapped toilet? No   TIMED UP AND GO:  Was the test performed? No .  Length of time to ambulate 10 feet: N/A sec.   Appearance of gait: Gait not evaluated during this visit.  Cognitive Function:        08/04/2022   11:01 AM  6CIT Screen  What Year? 0 points  What month? 0 points  What time? 0 points  Count back from 20 0 points  Months in reverse 0 points  Repeat phrase 0 points  Total Score 0 points    Immunizations Immunization History  Administered Date(s) Administered    Fluad Quad(high Dose 65+) 09/01/2021   Moderna SARS-COV2 Booster Vaccination 10/07/2020   [place] Sars-Covid-2 Vaccination 02/06/2020, 03/07/2020   Pneumococcal Conjugate-13 12/15/2020   Tdap 03/22/2022    TDAP status: Up to date  Flu Vaccine status: Due, Education has been provided regarding the importance of this vaccine. Advised may receive this vaccine at local pharmacy or Health Dept. Aware to provide a copy of the vaccination record if obtained from local pharmacy or Health Dept. Verbalized acceptance and understanding.  Pneumococcal vaccine status: Due, Education has been provided regarding the importance of this vaccine. Advised may receive this vaccine at local pharmacy or Health Dept. Aware to provide a copy of the vaccination record if obtained from local pharmacy or Health Dept. Verbalized acceptance and understanding.  Covid-19 vaccine status: Completed vaccines  Qualifies for Shingles Vaccine? Yes   Zostavax completed No   Shingrix Completed?: No.    Education has been provided regarding the importance of this vaccine. Patient has been advised to call insurance company to determine out of pocket expense if they have not yet received this vaccine. Advised may also receive vaccine at local pharmacy or Health Dept. Verbalized acceptance and understanding.  Screening Tests Health Maintenance  Topic Date Due   Hepatitis C Screening  Never done   Zoster Vaccines- Shingrix (1 of 2) Never done   COVID-19 Vaccine (3 - Moderna risk series) 11/04/2020   INFLUENZA VACCINE  06/22/2022   Pneumonia Vaccine 62+ Years old (2 - PPSV23 or PCV20) 02/02/2023 (Originally 12/15/2021)   COLONOSCOPY (Pts 45-58yr Insurance coverage will need to be confirmed)  03/12/2026   TETANUS/TDAP  03/22/2032   HPV VACCINES  Aged Out    Health Maintenance  Health Maintenance Due  Topic Date Due   Hepatitis C Screening  Never done   Zoster Vaccines- Shingrix (1 of 2) Never done   COVID-19 Vaccine (3 -  Moderna risk series) 11/04/2020   INFLUENZA VACCINE  06/22/2022    Colorectal cancer screening: No longer required.   Lung Cancer Screening: (Low Dose CT Chest recommended if Age 77-80years, 30 pack-year currently smoking OR have quit w/in 15years.) does not qualify.   Lung Cancer Screening Referral: NO  Additional Screening:  Hepatitis C Screening: does qualify; Completed NO  Vision Screening: Recommended annual ophthalmology exams for early detection of glaucoma and other disorders of the eye. Is the patient up to date with their annual eye exam?  Yes  Who is the provider or what is the name of the office in which the patient attends annual eye exams? SALLY MILLER, OD. If pt is not established with a provider, would they like to be referred to a provider to establish care? No .   Dental Screening: Recommended annual dental exams for proper oral hygiene  Community Resource Referral / Chronic Care Management: CRR required this visit?  No   CCM required this visit?  No      Plan:     I have personally reviewed and noted the following in the patient's chart:   Medical and social history Use of alcohol, tobacco or illicit drugs  Current medications and supplements including opioid prescriptions. Patient is not currently taking opioid prescriptions. Functional ability and status Nutritional status Physical activity Advanced directives List of other physicians Hospitalizations, surgeries, and ER visits in previous 12 months Vitals  Screenings to include cognitive, depression, and falls Referrals and appointments  In addition, I have reviewed and discussed with patient certain preventive protocols, quality metrics, and best practice recommendations. A written personalized care plan for preventive services as well as general preventive health recommendations were provided to patient.     Sheral Flow, LPN   01/30/2161   Nurse Notes:  Patient provided weight and  height for this visit. Normal cognitive status.   Medical screening examination/treatment/procedure(s) were performed by non-physician practitioner and as supervising physician I was immediately available for consultation/collaboration.  I agree with above. Lew Dawes, MD

## 2022-08-05 ENCOUNTER — Telehealth: Payer: Self-pay | Admitting: Internal Medicine

## 2022-08-05 DIAGNOSIS — M545 Low back pain, unspecified: Secondary | ICD-10-CM

## 2022-08-05 DIAGNOSIS — E785 Hyperlipidemia, unspecified: Secondary | ICD-10-CM

## 2022-08-05 DIAGNOSIS — C61 Malignant neoplasm of prostate: Secondary | ICD-10-CM

## 2022-08-05 NOTE — Telephone Encounter (Signed)
Pt has 6 month f/u appt with PCP 10/12. Pt scheduled lab appt 10/5.  Please enter lab orders.

## 2022-08-06 NOTE — Telephone Encounter (Signed)
Okay.  Thanks.

## 2022-08-26 ENCOUNTER — Other Ambulatory Visit (INDEPENDENT_AMBULATORY_CARE_PROVIDER_SITE_OTHER): Payer: Medicare Other

## 2022-08-26 DIAGNOSIS — E785 Hyperlipidemia, unspecified: Secondary | ICD-10-CM

## 2022-08-26 DIAGNOSIS — M545 Low back pain, unspecified: Secondary | ICD-10-CM | POA: Diagnosis not present

## 2022-08-26 DIAGNOSIS — C61 Malignant neoplasm of prostate: Secondary | ICD-10-CM | POA: Diagnosis not present

## 2022-08-26 LAB — CBC WITH DIFFERENTIAL/PLATELET
Basophils Absolute: 0 10*3/uL (ref 0.0–0.1)
Basophils Relative: 0.4 % (ref 0.0–3.0)
Eosinophils Absolute: 0.2 10*3/uL (ref 0.0–0.7)
Eosinophils Relative: 2.7 % (ref 0.0–5.0)
HCT: 43.5 % (ref 39.0–52.0)
Hemoglobin: 15 g/dL (ref 13.0–17.0)
Lymphocytes Relative: 31.5 % (ref 12.0–46.0)
Lymphs Abs: 2.1 10*3/uL (ref 0.7–4.0)
MCHC: 34.5 g/dL (ref 30.0–36.0)
MCV: 95.4 fl (ref 78.0–100.0)
Monocytes Absolute: 0.6 10*3/uL (ref 0.1–1.0)
Monocytes Relative: 8.6 % (ref 3.0–12.0)
Neutro Abs: 3.8 10*3/uL (ref 1.4–7.7)
Neutrophils Relative %: 56.8 % (ref 43.0–77.0)
Platelets: 184 10*3/uL (ref 150.0–400.0)
RBC: 4.56 Mil/uL (ref 4.22–5.81)
RDW: 13.9 % (ref 11.5–15.5)
WBC: 6.6 10*3/uL (ref 4.0–10.5)

## 2022-08-26 LAB — LIPID PANEL
Cholesterol: 133 mg/dL (ref 0–200)
HDL: 42.9 mg/dL (ref >39.00)
LDL Cholesterol: 72 mg/dL (ref 0–99)
NonHDL: 90.08
Total CHOL/HDL Ratio: 3
Triglycerides: 89 mg/dL (ref 0.0–149.0)
VLDL: 17.8 mg/dL (ref 0.0–40.0)

## 2022-08-26 LAB — COMPREHENSIVE METABOLIC PANEL
ALT: 14 U/L (ref 0–53)
AST: 18 U/L (ref 0–37)
Albumin: 4.1 g/dL (ref 3.5–5.2)
Alkaline Phosphatase: 64 U/L (ref 39–117)
BUN: 21 mg/dL (ref 6–23)
CO2: 27 mEq/L (ref 19–32)
Calcium: 9 mg/dL (ref 8.4–10.5)
Chloride: 104 mEq/L (ref 96–112)
Creatinine, Ser: 1.23 mg/dL (ref 0.40–1.50)
GFR: 56.91 mL/min — ABNORMAL LOW (ref >60.00)
Glucose, Bld: 122 mg/dL — ABNORMAL HIGH (ref 70–99)
Potassium: 4.2 mEq/L (ref 3.5–5.1)
Sodium: 138 mEq/L (ref 135–145)
Total Bilirubin: 2.3 mg/dL — ABNORMAL HIGH (ref 0.2–1.2)
Total Protein: 6.4 g/dL (ref 6.0–8.3)

## 2022-08-26 LAB — URINALYSIS
Bilirubin Urine: NEGATIVE
Ketones, ur: NEGATIVE
Leukocytes,Ua: NEGATIVE
Nitrite: NEGATIVE
Specific Gravity, Urine: 1.02 (ref 1.000–1.030)
Total Protein, Urine: NEGATIVE
Urine Glucose: NEGATIVE
Urobilinogen, UA: 1 (ref 0.0–1.0)
pH: 6 (ref 5.0–8.0)

## 2022-08-26 LAB — PSA: PSA: 0.07 ng/mL — ABNORMAL LOW (ref 0.10–4.00)

## 2022-08-26 LAB — TSH: TSH: 3.57 u[IU]/mL (ref 0.35–5.50)

## 2022-09-02 ENCOUNTER — Ambulatory Visit (INDEPENDENT_AMBULATORY_CARE_PROVIDER_SITE_OTHER): Payer: Medicare Other | Admitting: Internal Medicine

## 2022-09-02 ENCOUNTER — Encounter: Payer: Self-pay | Admitting: Internal Medicine

## 2022-09-02 DIAGNOSIS — R55 Syncope and collapse: Secondary | ICD-10-CM

## 2022-09-02 DIAGNOSIS — I2510 Atherosclerotic heart disease of native coronary artery without angina pectoris: Secondary | ICD-10-CM | POA: Diagnosis not present

## 2022-09-02 DIAGNOSIS — I2583 Coronary atherosclerosis due to lipid rich plaque: Secondary | ICD-10-CM | POA: Diagnosis not present

## 2022-09-02 DIAGNOSIS — E785 Hyperlipidemia, unspecified: Secondary | ICD-10-CM | POA: Diagnosis not present

## 2022-09-02 DIAGNOSIS — C61 Malignant neoplasm of prostate: Secondary | ICD-10-CM | POA: Diagnosis not present

## 2022-09-02 NOTE — Assessment & Plan Note (Signed)
F/u w/Dr Gasper Sells 2/22 coronary calcium CT score of 118. This was 36th percentile for age and sex matched control Calcium seen in the proximal and mid LAD proximal L circumflex. On ASA, Crestor

## 2022-09-02 NOTE — Assessment & Plan Note (Signed)
F/u w/Dr Milford Cage

## 2022-09-02 NOTE — Progress Notes (Signed)
Subjective:  Patient ID: Hawkins Seaman, male    DOB: March 13, 1946  Age: 76 y.o. MRN: [id number]  CC: Follow-up (6 month f/u)   HPI Andray Assefa presents for prostate cancer, syncope, decreased GFR  Outpatient Medications Prior to Visit  Medication Sig Dispense Refill   aspirin EC 81 MG tablet Take 1 tablet (81 mg total) by mouth daily. 100 tablet 3   benzonatate (TESSALON) 200 MG capsule Take 1 capsule (200 mg total) by mouth 2 (two) times daily as needed for cough. 20 capsule 0   cholecalciferol (VITAMIN D3) 25 MCG (1000 UNIT) tablet      Hypertonic Nasal Wash (SINUS RINSE) PACK Place into the nose daily.     ketoconazole (NIZORAL) 2 % cream APPLY A SMALL AMOUNT TOPICALLY BETWEEN THE TOES AND TO AFFECTED AREA(S) TWO TIMES A DAY AS NEEDED     loratadine (CLARITIN) 10 MG tablet Take 1 tablet (10 mg total) by mouth daily. 30 tablet 11   Niacinamide 500 MG TBCR Take 500 mg by mouth daily.     omeprazole (PRILOSEC) 20 MG capsule Take 1 capsule (20 mg total) by mouth daily. 90 capsule 3   rosuvastatin (CRESTOR) 10 MG tablet TAKE ONE TABLET BY MOUTH ONE TIME DAILY 90 tablet 3   No facility-administered medications prior to visit.    ROS: Review of Systems  Constitutional:  Negative for appetite change, fatigue and unexpected weight change.  HENT:  Negative for congestion, nosebleeds, sneezing, sore throat and trouble swallowing.   Eyes:  Negative for itching and visual disturbance.  Respiratory:  Negative for cough.   Cardiovascular:  Negative for chest pain, palpitations and leg swelling.  Gastrointestinal:  Negative for abdominal distention, blood in stool, diarrhea and nausea.  Genitourinary:  Negative for frequency and hematuria.  Musculoskeletal:  Negative for back pain, gait problem, joint swelling and neck pain.  Skin:  Negative for rash.  Neurological:  Negative for dizziness, tremors, speech difficulty and weakness.  Psychiatric/Behavioral:  Negative for agitation, dysphoric mood  and sleep disturbance. The patient is not nervous/anxious.     Objective:  BP 122/60 (BP Location: Left Arm)   Pulse (!) 57   Temp 97.9 F (36.6 C) (Oral)   Ht 6' (1.829 m)   Wt 190 lb (86.2 kg)   SpO2 95%   BMI 25.77 kg/m   BP Readings from Last 3 Encounters:  09/02/22 122/60  07/07/22 122/68  04/26/22 132/76    Wt Readings from Last 3 Encounters:  09/02/22 190 lb (86.2 kg)  08/04/22 192 lb (87.1 kg)  07/07/22 194 lb 6.4 oz (88.2 kg)    Physical Exam Constitutional:      General: He is not in acute distress.    Appearance: Normal appearance. He is well-developed.     Comments: NAD  Eyes:     Conjunctiva/sclera: Conjunctivae normal.     Pupils: Pupils are equal, round, and reactive to light.  Neck:     Thyroid: No thyromegaly.     Vascular: No JVD.  Cardiovascular:     Rate and Rhythm: Normal rate and regular rhythm.     Heart sounds: Normal heart sounds. No murmur heard.    No friction rub. No gallop.  Pulmonary:     Effort: Pulmonary effort is normal. No respiratory distress.     Breath sounds: Normal breath sounds. No wheezing or rales.  Chest:     Chest wall: No tenderness.  Abdominal:     General: Bowel sounds are  normal. There is no distension.     Palpations: Abdomen is soft. There is no mass.     Tenderness: There is no abdominal tenderness. There is no guarding or rebound.  Musculoskeletal:        General: No tenderness. Normal range of motion.     Cervical back: Normal range of motion.  Lymphadenopathy:     Cervical: No cervical adenopathy.  Skin:    General: Skin is warm and dry.     Findings: No rash.  Neurological:     Mental Status: He is alert and oriented to person, place, and time.     Cranial Nerves: No cranial nerve deficit.     Motor: No abnormal muscle tone.     Coordination: Coordination normal.     Gait: Gait normal.     Deep Tendon Reflexes: Reflexes are normal and symmetric.  Psychiatric:        Behavior: Behavior normal.         Thought Content: Thought content normal.        Judgment: Judgment normal.     Lab Results  Component Value Date   WBC 6.6 08/26/2022   HGB 15.0 08/26/2022   HCT 43.5 08/26/2022   PLT 184.0 08/26/2022   GLUCOSE 122 (H) 08/26/2022   CHOL 133 08/26/2022   TRIG 89.0 08/26/2022   HDL 42.90 08/26/2022   LDLCALC 72 08/26/2022   ALT 14 08/26/2022   AST 18 08/26/2022   NA 138 08/26/2022   K 4.2 08/26/2022   CL 104 08/26/2022   CREATININE 1.23 08/26/2022   BUN 21 08/26/2022   CO2 27 08/26/2022   TSH 3.57 08/26/2022   PSA 0.07 (L) 08/26/2022    CT Head Wo Contrast  Result Date: 03/22/2022 CLINICAL DATA:  Syncopal episode getting out of bed this morning. Fall with facial laceration in the right eyebrow region. Nose bleed. EXAM: CT HEAD WITHOUT CONTRAST CT MAXILLOFACIAL WITHOUT CONTRAST TECHNIQUE: Multidetector CT imaging of the head and maxillofacial structures were performed using the standard protocol without intravenous contrast. Multiplanar CT image reconstructions of the maxillofacial structures were also generated. RADIATION DOSE REDUCTION: This exam was performed according to the departmental dose-optimization program which includes automated exposure control, adjustment of the mA and/or kV according to patient size and/or use of iterative reconstruction technique. COMPARISON:  None. FINDINGS: CT HEAD FINDINGS Brain: There is no evidence for acute hemorrhage, hydrocephalus, mass lesion, or abnormal extra-axial fluid collection. No definite CT evidence for acute infarction. Vascular: No hyperdense vessel or unexpected calcification. Skull: No evidence for fracture. No worrisome lytic or sclerotic lesion. Other: None. CT MAXILLOFACIAL FINDINGS Osseous: Minimally displaced nasal bone fractures evident. No other acute facial bone fracture evident. No mandibular fracture or dislocation. No destructive process. Orbits: Negative. No traumatic or inflammatory finding. Sinuses: Surgical changes  are noted in the maxillary sinuses bilaterally chronic mucosal disease is seen in the ethmoid and frontal sinuses. Air-fluid level noted left sphenoid sinus and left maxillary sinus. Soft tissues: Edema and skin irregularity in the right supraorbital scalp is compatible with the reported history of laceration. IMPRESSION: 1. No acute intracranial abnormality. 2. Minimally displaced nasal bone fractures. No other maxillofacial fracture on the current study. 3. 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 Air-fluid levels may be related to the reported history of nose bleed. Electronically Signed   By: Misty Stanley M.D.   On: 03/22/2022 07:48   CT Maxillofacial Wo Contrast  Result Date: 03/22/2022 CLINICAL DATA:  Syncopal episode getting out of bed this morning. Fall with facial laceration in the right eyebrow region. Nose bleed. EXAM: CT HEAD WITHOUT CONTRAST CT MAXILLOFACIAL WITHOUT CONTRAST TECHNIQUE: Multidetector CT imaging of the head and maxillofacial structures were performed using the standard protocol without intravenous contrast. Multiplanar CT image reconstructions of the maxillofacial structures were also generated. RADIATION DOSE REDUCTION: This exam was performed according to the departmental dose-optimization program which includes automated exposure control, adjustment of the mA and/or kV according to patient size and/or use of iterative reconstruction technique. COMPARISON:  None. FINDINGS: CT HEAD FINDINGS Brain: There is no evidence for acute hemorrhage, hydrocephalus, mass lesion, or abnormal extra-axial fluid collection. No definite CT evidence for acute infarction. Vascular: No hyperdense vessel or unexpected calcification. Skull: No evidence for fracture. No worrisome lytic or sclerotic lesion. Other: None. CT MAXILLOFACIAL FINDINGS Osseous: Minimally displaced nasal bone fractures evident. No other acute facial bone fracture  evident. No mandibular fracture or dislocation. No destructive process. Orbits: Negative. No traumatic or inflammatory finding. Sinuses: Surgical changes are noted in the maxillary sinuses bilaterally chronic mucosal disease is seen in the ethmoid and frontal sinuses. Air-fluid level noted left sphenoid sinus and left maxillary sinus. Soft tissues: Edema and skin irregularity in the right supraorbital scalp is compatible with the reported history of laceration. IMPRESSION: 1. No acute intracranial abnormality. 2. Minimally displaced nasal bone fractures. No other maxillofacial fracture on the current study. 3. 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 Air-fluid levels may be related to the reported history of nose bleed. Electronically Signed   By: Misty Stanley M.D.   On: 03/22/2022 07:48   DG Chest Portable 1 View  Result Date: 03/22/2022 CLINICAL DATA:  Syncope this morning EXAM: PORTABLE CHEST 1 VIEW COMPARISON:  02/01/2022 FINDINGS: Normal heart size. Negative mediastinal contours. There is distortion from rightward rotation. Borderline atelectasis at the bases. There is no edema, consolidation, effusion, or pneumothorax. IMPRESSION: No evidence of active disease. Electronically Signed   By: Jorje Guild M.D.   On: 03/22/2022 07:36    Assessment & Plan:   Problem List Items Addressed This Visit     Coronary atherosclerosis    Cont on aspirin and Crestor      Dyslipidemia    F/u w/Dr Gasper Sells 2/22 coronary calcium CT score of 118. This was 36th percentile for age and sex matched control Calcium seen in the proximal and mid LAD proximal L circumflex. On ASA, Crestor      Prostate cancer ([place])    F/u w/Dr Milford Cage          No orders of the defined types were placed in this encounter.     Follow-up: Return in about 6 months (around 03/04/2023) for a follow-up visit.  Walker Kehr, MD

## 2022-09-02 NOTE — Assessment & Plan Note (Signed)
Cont on aspirin and Crestor

## 2022-09-02 NOTE — Assessment & Plan Note (Signed)
No relapse 

## 2022-10-13 ENCOUNTER — Ambulatory Visit: Payer: Medicare Other | Attending: Internal Medicine | Admitting: Internal Medicine

## 2022-10-13 ENCOUNTER — Encounter: Payer: Self-pay | Admitting: Internal Medicine

## 2022-10-13 VITALS — BP 128/82 | HR 61 | Ht 72.0 in | Wt 186.8 lb

## 2022-10-13 DIAGNOSIS — R55 Syncope and collapse: Secondary | ICD-10-CM | POA: Diagnosis not present

## 2022-10-13 DIAGNOSIS — I2510 Atherosclerotic heart disease of native coronary artery without angina pectoris: Secondary | ICD-10-CM | POA: Diagnosis not present

## 2022-10-13 DIAGNOSIS — I2583 Coronary atherosclerosis due to lipid rich plaque: Secondary | ICD-10-CM | POA: Insufficient documentation

## 2022-10-13 DIAGNOSIS — I4510 Unspecified right bundle-branch block: Secondary | ICD-10-CM | POA: Insufficient documentation

## 2022-10-13 DIAGNOSIS — E785 Hyperlipidemia, unspecified: Secondary | ICD-10-CM | POA: Diagnosis not present

## 2022-10-13 NOTE — Progress Notes (Signed)
Cardiology Office Note:    Date:  10/13/2022   ID:  Shawn Orozco, DOB July 25, 1946, MRN [id number]  PCP:  Cassandria Anger, MD   [place]  Cardiologist:  Werner Lean, MD  Advanced Practice Provider:  No care team member to display Electrophysiologist:  None      CC: CAC follow up  History of Present Illness:    Shawn Orozco is a 76 y.o. male with a hx of HLD, and coronary artery calcification who presents for evaluation 01/05/21.  2022 CAC LDL goal < 70 started statin 2023; Vasovagal syncope; echo was normal  Patient notes that he is doing well.   Since last visit notes no further syncope . He recounts his near syncopal episode he stood up and passed ou. There are no interval hospital/ED visit.    No chest pain or pressure .  No SOB/DOE and no PND/Orthopnea.  No weight gain or leg swelling.  No palpitations or syncope .   Past Medical History:  Diagnosis Date   Arthritis    Cataract    removed bilat 2014 with lens implants    Colon polyps    Deviated septum    GERD (gastroesophageal reflux disease)    Hyperlipidemia    preventative for FH    Prostate cancer ([place]) 2021   04-14-2020 prostate surgery     Past Surgical History:  Procedure Laterality Date   CATARACT EXTRACTION, BILATERAL     COLONOSCOPY     KNEE ARTHROSCOPY Right    NASAL SEPTUM SURGERY     POLYPECTOMY     PROSTATECTOMY  04/14/2020   SHOULDER ARTHROSCOPY Right    TONSILLECTOMY      Current Medications: Current Meds  Medication Sig   aspirin EC 81 MG tablet Take 1 tablet (81 mg total) by mouth daily.   cholecalciferol (VITAMIN D3) 25 MCG (1000 UNIT) tablet Take 1,000 Units by mouth daily.   Hypertonic Nasal Wash (SINUS RINSE) PACK Place into the nose as needed.   ketoconazole (NIZORAL) 2 % cream APPLY A SMALL AMOUNT TOPICALLY BETWEEN THE TOES AND TO AFFECTED AREA(S) TWO TIMES A DAY AS NEEDED   loratadine (CLARITIN) 10 MG tablet Take 1 tablet (10 mg total) by  mouth daily.   Niacinamide 500 MG TBCR Take 500 mg by mouth daily.   omeprazole (PRILOSEC) 20 MG capsule Take 1 capsule (20 mg total) by mouth daily.   rosuvastatin (CRESTOR) 10 MG tablet TAKE ONE TABLET BY MOUTH ONE TIME DAILY   [DISCONTINUED] benzonatate (TESSALON) 200 MG capsule Take 1 capsule (200 mg total) by mouth 2 (two) times daily as needed for cough.     Allergies:   Patient has no known allergies.   Social History   Socioeconomic History   Marital status: Divorced    Spouse name: Not on file   Number of children: 0   Years of education: Not on file   Highest education level: Not on file  Occupational History   Occupation: retired  Tobacco Use   Smoking status: Never   Smokeless tobacco: Never  Vaping Use   Vaping Use: Never used  Substance and Sexual Activity   Alcohol use: Never   Drug use: Never   Sexual activity: Not Currently  Other Topics Concern   Not on file  Social History Narrative   Not on file   Social Determinants of Health   Financial Resource Strain: Low Risk  (08/04/2022)   Overall Emergency planning/management officer Strain (  CARDIA)    Difficulty of Paying Living Expenses: Not hard at all  Food Insecurity: No Food Insecurity (08/04/2022)   Hunger Vital Sign    Worried About Running Out of Food in the Last Year: Never true    Ran Out of Food in the Last Year: Never true  Transportation Needs: No Transportation Needs (08/04/2022)   PRAPARE - Hydrologist (Medical): No    Lack of Transportation (Non-Medical): No  Physical Activity: Sufficiently Active (08/04/2022)   Exercise Vital Sign    Days of Exercise per Week: 7 days    Minutes of Exercise per Session: 60 min  Stress: No Stress Concern Present (08/04/2022)   [place]    Feeling of Stress : Not at all  Social Connections: Moderately Integrated (08/04/2022)   Social Connection and Isolation Panel [NHANES]     Frequency of Communication with Friends and Family: More than three times a week    Frequency of Social Gatherings with Friends and Family: More than three times a week    Attends Religious Services: More than 4 times per year    Active Member of [employer] or Organizations: Yes    Attends Archivist Meetings: More than 4 times per year    Marital Status: Divorced    SOCIAL:  Moved to be with his brother who passed October 08/2020; Remodeled that house during our time together and in 2022 finished the yard  Family History: The patient's family history includes Alzheimer's disease in his brother and mother; Heart disease in his brother and brother. There is no history of Colon cancer, Colon polyps, Esophageal cancer, Rectal cancer, or Stomach cancer. History of coronary artery disease notable for two (half) brothers. History of heart failure notable for no members. History of arrhythmia notable for no members. No history of bicuspid aortic valve or aortic aneurysm or dissection.  ROS:   Please see the history of present illness.     All other systems reviewed and are negative.  EKGs/Labs/Other Studies Reviewed:    The following studies were reviewed today:  EKG:   01/05/21: SR rate 64 RBBB  CAC Date: 12/24/20 Results: Coronary calcium score of 118. This was 36th percentile for age and sex matched control  Cardiac Studies & Procedures       ECHOCARDIOGRAM  ECHOCARDIOGRAM COMPLETE 04/28/2022  Narrative ECHOCARDIOGRAM REPORT    Patient Name:   Shawn Orozco   Date of Exam: 04/28/2022 Medical Rec #:  [id number]     Height:       72.0 in Accession #:    [id number]    Weight:       197.0 lb Date of Birth:  [birth year]    BSA:          2.117 m Patient Age:    47 years      BP:           132/76 mmHg Patient Gender: M             HR:           63 bpm. Exam Location:  [facility]  Procedure: 2D Echo, 3D Echo, Cardiac Doppler, Color Doppler and Strain Analysis  Indications:     R55 Syncope  History:        Patient has no prior history of Echocardiogram examinations. Risk Factors:Dyslipidemia.  Sonographer:    Cresenciano Lick RDCS Referring Phys: [id number] Lanice Schwab [facility]  IMPRESSIONS   1. Left ventricular ejection fraction, by estimation, is 55 to 60%. The left ventricle has normal function. The left ventricle has no regional wall motion abnormalities. Left ventricular diastolic parameters are consistent with Grade I diastolic dysfunction (impaired relaxation). The average left ventricular global longitudinal strain is -21.5 %. The global longitudinal strain is normal. 2. Right ventricular systolic function is normal. The right ventricular size is normal. Tricuspid regurgitation signal is inadequate for assessing PA pressure. 3. The mitral valve is grossly normal. Trivial mitral valve regurgitation. No evidence of mitral stenosis. 4. The aortic valve is tricuspid. Aortic valve regurgitation is not visualized. No aortic stenosis is present. 5. The inferior vena cava is normal in size with greater than 50% respiratory variability, suggesting right atrial pressure of 3 mmHg.  Conclusion(s)/Recommendation(s): Normal biventricular function without evidence of hemodynamically significant valvular heart disease.  FINDINGS Left Ventricle: Left ventricular ejection fraction, by estimation, is 55 to 60%. The left ventricle has normal function. The left ventricle has no regional wall motion abnormalities. The average left ventricular global longitudinal strain is -21.5 %. The global longitudinal strain is normal. 3D left ventricular ejection fraction analysis performed but not reported based on interpreter judgement due to suboptimal tracking. The left ventricular internal cavity size was normal in size. There is no left ventricular hypertrophy. Left ventricular diastolic parameters are consistent with Grade I diastolic dysfunction (impaired relaxation).  Right  Ventricle: The right ventricular size is normal. No increase in right ventricular wall thickness. Right ventricular systolic function is normal. Tricuspid regurgitation signal is inadequate for assessing PA pressure.  Left Atrium: Left atrial size was normal in size.  Right Atrium: Right atrial size was normal in size.  Pericardium: There is no evidence of pericardial effusion.  Mitral Valve: The mitral valve is grossly normal. Trivial mitral valve regurgitation. No evidence of mitral valve stenosis.  Tricuspid Valve: The tricuspid valve is grossly normal. Tricuspid valve regurgitation is not demonstrated. No evidence of tricuspid stenosis.  Aortic Valve: The aortic valve is tricuspid. Aortic valve regurgitation is not visualized. No aortic stenosis is present.  Pulmonic Valve: The pulmonic valve was grossly normal. Pulmonic valve regurgitation is not visualized. No evidence of pulmonic stenosis.  Aorta: The aortic root and ascending aorta are structurally normal, with no evidence of dilitation.  Venous: The inferior vena cava is normal in size with greater than 50% respiratory variability, suggesting right atrial pressure of 3 mmHg.  IAS/Shunts: The atrial septum is grossly normal.   LEFT VENTRICLE PLAX 2D LVIDd:         3.80 cm   Diastology LVIDs:         2.10 cm   LV e' medial:    6.71 cm/s LV PW:         1.00 cm   LV E/e' medial:  9.8 LV IVS:        1.10 cm   LV e' lateral:   7.71 cm/s LVOT diam:     2.00 cm   LV E/e' lateral: 8.6 LV SV:         82 LV SV Index:   39        2D Longitudinal Strain LVOT Area:     3.14 cm  2D Strain GLS (A2C):   -21.1 % 2D Strain GLS (A3C):   -22.0 % 2D Strain GLS (A4C):   -21.3 % 2D Strain GLS Avg:     -21.5 %  3D Volume EF: 3D EF:  53 % LV EDV:       156 ml LV ESV:       73 ml LV SV:        83 ml  RIGHT VENTRICLE             IVC RV Basal diam:  4.40 cm     IVC diam: 1.70 cm RV S prime:     13.20 cm/s TAPSE (M-mode): 2.6  cm  LEFT ATRIUM             Index        RIGHT ATRIUM           Index LA diam:        4.10 cm 1.94 cm/m   RA Area:     11.30 cm LA Vol (A2C):   42.4 ml 20.03 ml/m  RA Volume:   23.30 ml  11.01 ml/m LA Vol (A4C):   31.9 ml 15.07 ml/m LA Biplane Vol: 36.7 ml 17.34 ml/m AORTIC VALVE LVOT Vmax:   119.50 cm/s LVOT Vmean:  76.900 cm/s LVOT VTI:    0.260 m  AORTA Ao Root diam: 3.90 cm Ao Asc diam:  3.70 cm  MITRAL VALVE MV Area (PHT): 2.83 cm    SHUNTS MV Decel Time: 268 msec    Systemic VTI:  0.26 m MV E velocity: 66.00 cm/s  Systemic Diam: 2.00 cm MV A velocity: 95.40 cm/s MV E/A ratio:  0.69  Eleonore Chiquito MD Electronically signed by Eleonore Chiquito MD Signature Date/Time: 04/28/2022/11:32:10 AM    Final     CT SCANS  CT CARDIAC SCORING (SELF PAY ONLY) 12/24/2020  Addendum 12/24/2020 12:19 PM ADDENDUM REPORT: 12/24/2020 12:17  CLINICAL DATA:  Risk stratification, CAD screening, 54yrCHD risk < 10%, Dyslipidemia  EXAM: Coronary Calcium Score  TECHNIQUE: The patient was scanned on a [employer]canner. Axial non-contrast 3 mm slices were carried out through the heart. The data set was analyzed on a dedicated work station and scored using the Agatston method.  FINDINGS: [facility]cardiac: [facility]  Motion artifact is present.  Ascending Aorta: 36 mm at the mid ascending aorta measured in an axial plane.  Pericardium: Normal  Coronary arteries:  Coronary calcium score of 118. This was 36th percentile for age and sex matched control.  Calcium seen in the proximal and mid LAD proximal L circumflex.  IMPRESSION: Coronary calcium score of 118. This was 36th percentile for age and sex matched control.   Electronically Signed By: GCherlynn KaiserOn: 12/24/2020 12:17  Narrative EXAM: OVER-READ INTERPRETATION  CT CHEST  The following report is an over-read performed by radiologist Dr. DVinnie Langtonof [facility] PCumingon 12/24/2020. This over-read does not include interpretation of cardiac or coronary anatomy or pathology. The coronary calcium score interpretation by the cardiologist is attached.  COMPARISON:  None.  FINDINGS: Within the visualized portions of the thorax there are no suspicious appearing pulmonary nodules or masses, there is no acute consolidative airspace disease, no pleural effusions, no pneumothorax and no lymphadenopathy. Visualized portions of the upper abdomen are unremarkable. There are no aggressive appearing lytic or blastic lesions noted in the visualized portions of the skeleton.  IMPRESSION: 1. No significant incidental noncardiac findings are noted.  Electronically Signed: By: DVinnie LangtonM.D. On: 12/24/2020 08:51           Recent Labs: 08/26/2022: ALT 14; BUN 21; Creatinine, Ser 1.23; Hemoglobin 15.0; Platelets 184.0; Potassium 4.2; Sodium 138;  TSH 3.57  Recent Lipid Panel    Component Value Date/Time   CHOL 133 08/26/2022 0748   CHOL 154 05/07/2021 0732   TRIG 89.0 08/26/2022 0748   HDL 42.90 08/26/2022 0748   HDL 45 05/07/2021 0732   CHOLHDL 3 08/26/2022 0748   VLDL 17.8 08/26/2022 0748   LDLCALC 72 08/26/2022 0748   LDLCALC 88 05/07/2021 0732    Physical Exam:    VS:  BP 128/82   Pulse 61   Ht 6' (1.829 m)   Wt 186 lb 12.8 oz (84.7 kg)   SpO2 96%   BMI 25.33 kg/m     Wt Readings from Last 3 Encounters:  10/13/22 186 lb 12.8 oz (84.7 kg)  09/02/22 190 lb (86.2 kg)  08/04/22 192 lb (87.1 kg)     GEN:  Well nourished, well developed in no acute distress HEENT: Normal NECK: No JVD CARDIAC: RRR, no murmurs, rubs, gallops RESPIRATORY:  Clear to auscultation without rales, wheezing or rhonchi  ABDOMEN: Soft, non-tender, non-distended MUSCULOSKELETAL:  No edema; No deformity  SKIN: Warm and dry NEUROLOGIC:  Alert and oriented x 3 PSYCHIATRIC:  Normal affect   ASSESSMENT:    1. 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 RBBB   3. Dyslipidemia   4. Vasovagal symptom     PLAN:    Coronary Artery Calcification HLD -LDL goal less than 70 - I offered an increase in statin; he was previously at goal. He would like to try diet and exercise interventions; we reviewed these.  We will recheck in 3 months; if still above goal increase to rosuvastatin 20 mg.  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  Concerns regarding medicines are outlined above.  Orders Placed This Encounter  Procedures   Lipid panel   Hepatic function panel    No orders of the defined types were placed in this encounter.    Patient Instructions  Medication Instructions:  Your physician recommends that you continue on your current medications as directed. Please refer to the Current Medication list given to you today.  *If you need a refill on your cardiac medications before your next appointment, please call your pharmacy*  Lab Work: In 3 months: fasting lipid panel and LFTs If you have labs (blood work) drawn today and your tests are completely normal, you will receive your results only by: [place] (if you have MyChart) OR A paper copy in the mail If you have any lab test that is abnormal or we need to change your treatment, we will call you to review the results.  Follow-Up: At [facility], you and your health needs are our priority.  As part of our continuing mission to provide you with exceptional heart care, we have created designated Provider Care Teams.  These Care Teams include your primary Cardiologist (physician) and Advanced Practice  Providers (APPs -  Physician Assistants and Nurse Practitioners) who all work together to provide you with the care you need, when you need it.  Your next appointment:   1 year(s)  The format for your next appointment:   In  Person  Provider:   Werner Lean, MD  or Nicholes Rough, PA-C, Ambrose Pancoast, NP, or Richardson Dopp, PA-C      Important Information About Sugar         Signed, Werner Lean, MD  10/13/2022 8:42 AM    [place]

## 2022-10-13 NOTE — Patient Instructions (Signed)
Medication Instructions:  Your physician recommends that you continue on your current medications as directed. Please refer to the Current Medication list given to you today.  *If you need a refill on your cardiac medications before your next appointment, please call your pharmacy*  Lab Work: In 3 months: fasting lipid panel and LFTs If you have labs (blood work) drawn today and your tests are completely normal, you will receive your results only by: [place] (if you have MyChart) OR A paper copy in the mail If you have any lab test that is abnormal or we need to change your treatment, we will call you to review the results.  Follow-Up: At [facility], you and your health needs are our priority.  As part of our continuing mission to provide you with exceptional heart care, we have created designated Provider Care Teams.  These Care Teams include your primary Cardiologist (physician) and Advanced Practice Providers (APPs -  Physician Assistants and Nurse Practitioners) who all work together to provide you with the care you need, when you need it.  Your next appointment:   1 year(s)  The format for your next appointment:   In Person  Provider:   Werner Lean, MD  or Nicholes Rough, PA-C, Ambrose Pancoast, NP, or Richardson Dopp, PA-C      Important Information About Sugar

## 2022-12-30 DIAGNOSIS — J019 Acute sinusitis, unspecified: Secondary | ICD-10-CM | POA: Insufficient documentation

## 2023-01-12 ENCOUNTER — Ambulatory Visit: Payer: Medicare Other | Attending: Internal Medicine

## 2023-01-12 DIAGNOSIS — E785 Hyperlipidemia, unspecified: Secondary | ICD-10-CM

## 2023-01-12 DIAGNOSIS — I2510 Atherosclerotic heart disease of native coronary artery without angina pectoris: Secondary | ICD-10-CM

## 2023-01-12 LAB — LIPID PANEL
Chol/HDL Ratio: 3.5 ratio (ref 0.0–5.0)
Cholesterol, Total: 159 mg/dL (ref 100–199)
HDL: 45 mg/dL (ref >39)
LDL Chol Calc (NIH): 85 mg/dL (ref 0–99)
Triglycerides: 170 mg/dL — ABNORMAL HIGH (ref 0–149)
VLDL Cholesterol Cal: 29 mg/dL (ref 5–40)

## 2023-01-12 LAB — HEPATIC FUNCTION PANEL
ALT: 25 IU/L (ref 0–44)
AST: 18 IU/L (ref 0–40)
Albumin: 4.3 g/dL (ref 3.8–4.8)
Alkaline Phosphatase: 90 IU/L (ref 44–121)
Bilirubin Total: 2 mg/dL — ABNORMAL HIGH (ref 0.0–1.2)
Bilirubin, Direct: 0.37 mg/dL (ref 0.00–0.40)
Total Protein: 6.5 g/dL (ref 6.0–8.5)

## 2023-01-13 ENCOUNTER — Telehealth: Payer: Self-pay | Admitting: Internal Medicine

## 2023-01-13 DIAGNOSIS — E785 Hyperlipidemia, unspecified: Secondary | ICD-10-CM

## 2023-01-13 DIAGNOSIS — I2510 Atherosclerotic heart disease of native coronary artery without angina pectoris: Secondary | ICD-10-CM

## 2023-01-13 MED ORDER — ROSUVASTATIN CALCIUM 20 MG PO TABS: 20.0000 mg | ORAL_TABLET | Freq: Every day | ORAL | 3 refills | Status: DC

## 2023-01-13 NOTE — Telephone Encounter (Signed)
-----   Message from Werner Lean, MD sent at 01/13/2023 10:59 AM EST ----- Results: Elevated Tgs may indicate lack of fasting Minimal LDL improved despite lifestyle interventions Plan: Offer doubling of statin and fasting Lipids and ALT in three months  Werner Lean, MD

## 2023-01-13 NOTE — Telephone Encounter (Signed)
Follow Up:      Patient want Dr Gasper Sells to know he received the message about increasing his Rosuvastatin. Patient said that was fine with him. an

## 2023-01-13 NOTE — Telephone Encounter (Signed)
Spoke w patient and he is in agreement with plan to increase statin to 20 mg daily.  Lab appointment made for 12 weeks.  Pt reports he has had an acute sinus infection for the last 3 weeks and has been on antibiotics and maybe has not been eating as well as he should.  Despite this, he is still in agreement w plan.

## 2023-03-03 ENCOUNTER — Other Ambulatory Visit (INDEPENDENT_AMBULATORY_CARE_PROVIDER_SITE_OTHER): Payer: Medicare Other

## 2023-03-03 DIAGNOSIS — I2583 Coronary atherosclerosis due to lipid rich plaque: Secondary | ICD-10-CM | POA: Diagnosis not present

## 2023-03-03 DIAGNOSIS — I2510 Atherosclerotic heart disease of native coronary artery without angina pectoris: Secondary | ICD-10-CM | POA: Diagnosis not present

## 2023-03-03 DIAGNOSIS — E785 Hyperlipidemia, unspecified: Secondary | ICD-10-CM

## 2023-03-03 DIAGNOSIS — R55 Syncope and collapse: Secondary | ICD-10-CM

## 2023-03-03 LAB — URINALYSIS
Bilirubin Urine: NEGATIVE
Ketones, ur: NEGATIVE
Leukocytes,Ua: NEGATIVE
Nitrite: NEGATIVE
Specific Gravity, Urine: >=1.03 — AB (ref 1.000–1.030)
Total Protein, Urine: 30 — AB
Urine Glucose: NEGATIVE
Urobilinogen, UA: 1 (ref 0.0–1.0)
pH: 5.5 (ref 5.0–8.0)

## 2023-03-03 LAB — CBC WITH DIFFERENTIAL/PLATELET
Basophils Absolute: 0 10*3/uL (ref 0.0–0.1)
Basophils Relative: 0.6 % (ref 0.0–3.0)
Eosinophils Absolute: 0.2 10*3/uL (ref 0.0–0.7)
Eosinophils Relative: 2 % (ref 0.0–5.0)
HCT: 45.3 % (ref 39.0–52.0)
Hemoglobin: 15.8 g/dL (ref 13.0–17.0)
Lymphocytes Relative: 32.6 % (ref 12.0–46.0)
Lymphs Abs: 2.4 10*3/uL (ref 0.7–4.0)
MCHC: 34.9 g/dL (ref 30.0–36.0)
MCV: 95.3 fl (ref 78.0–100.0)
Monocytes Absolute: 0.6 10*3/uL (ref 0.1–1.0)
Monocytes Relative: 7.7 % (ref 3.0–12.0)
Neutro Abs: 4.3 10*3/uL (ref 1.4–7.7)
Neutrophils Relative %: 57.1 % (ref 43.0–77.0)
Platelets: 217 10*3/uL (ref 150.0–400.0)
RBC: 4.75 Mil/uL (ref 4.22–5.81)
RDW: 13.6 % (ref 11.5–15.5)
WBC: 7.5 10*3/uL (ref 4.0–10.5)

## 2023-03-03 LAB — COMPREHENSIVE METABOLIC PANEL
ALT: 13 U/L (ref 0–53)
AST: 16 U/L (ref 0–37)
Albumin: 4.2 g/dL (ref 3.5–5.2)
Alkaline Phosphatase: 72 U/L (ref 39–117)
BUN: 22 mg/dL (ref 6–23)
CO2: 29 mEq/L (ref 19–32)
Calcium: 9.6 mg/dL (ref 8.4–10.5)
Chloride: 104 mEq/L (ref 96–112)
Creatinine, Ser: 1.42 mg/dL (ref 0.40–1.50)
GFR: 47.72 mL/min — ABNORMAL LOW (ref >60.00)
Glucose, Bld: 133 mg/dL — ABNORMAL HIGH (ref 70–99)
Potassium: 4.3 mEq/L (ref 3.5–5.1)
Sodium: 139 mEq/L (ref 135–145)
Total Bilirubin: 1.5 mg/dL — ABNORMAL HIGH (ref 0.2–1.2)
Total Protein: 7 g/dL (ref 6.0–8.3)

## 2023-03-03 LAB — URINALYSIS, ROUTINE W REFLEX MICROSCOPIC
Bilirubin Urine: NEGATIVE
Ketones, ur: NEGATIVE
Leukocytes,Ua: NEGATIVE
Nitrite: NEGATIVE
Specific Gravity, Urine: >=1.03 — AB (ref 1.000–1.030)
Total Protein, Urine: 30 — AB
Urine Glucose: NEGATIVE
Urobilinogen, UA: 1 (ref 0.0–1.0)
pH: 5.5 (ref 5.0–8.0)

## 2023-03-03 LAB — LIPID PANEL
Cholesterol: 144 mg/dL (ref 0–200)
HDL: 40.9 mg/dL (ref >39.00)
LDL Cholesterol: 68 mg/dL (ref 0–99)
NonHDL: 103.4
Total CHOL/HDL Ratio: 4
Triglycerides: 177 mg/dL — ABNORMAL HIGH (ref 0.0–149.0)
VLDL: 35.4 mg/dL (ref 0.0–40.0)

## 2023-03-03 LAB — TSH: TSH: 3.44 u[IU]/mL (ref 0.35–5.50)

## 2023-03-07 ENCOUNTER — Encounter: Payer: Self-pay | Admitting: Internal Medicine

## 2023-03-07 ENCOUNTER — Ambulatory Visit (INDEPENDENT_AMBULATORY_CARE_PROVIDER_SITE_OTHER): Payer: Medicare Other | Admitting: Internal Medicine

## 2023-03-07 VITALS — BP 130/70 | HR 76 | Temp 98.3°F | Ht 72.0 in | Wt 195.0 lb

## 2023-03-07 DIAGNOSIS — C61 Malignant neoplasm of prostate: Secondary | ICD-10-CM | POA: Diagnosis not present

## 2023-03-07 DIAGNOSIS — E785 Hyperlipidemia, unspecified: Secondary | ICD-10-CM | POA: Diagnosis not present

## 2023-03-07 DIAGNOSIS — I2583 Coronary atherosclerosis due to lipid rich plaque: Secondary | ICD-10-CM | POA: Diagnosis not present

## 2023-03-07 DIAGNOSIS — I2510 Atherosclerotic heart disease of native coronary artery without angina pectoris: Secondary | ICD-10-CM | POA: Diagnosis not present

## 2023-03-07 DIAGNOSIS — R739 Hyperglycemia, unspecified: Secondary | ICD-10-CM

## 2023-03-07 DIAGNOSIS — N1831 Chronic kidney disease, stage 3a: Secondary | ICD-10-CM | POA: Diagnosis not present

## 2023-03-07 DIAGNOSIS — N2889 Other specified disorders of kidney and ureter: Secondary | ICD-10-CM | POA: Insufficient documentation

## 2023-03-07 NOTE — Assessment & Plan Note (Signed)
Calcium seen in the proximal and mid LAD proximal L circumflex. On ASA, Crestor

## 2023-03-07 NOTE — Assessment & Plan Note (Signed)
F/u w/Dr Benancio Deeds  2024 PSA 0.16 (doubled)  PET CT is pending

## 2023-03-07 NOTE — Assessment & Plan Note (Signed)
GFR 47.7 Hydrate better RTC w/A1c in 3 mo PET CT is pending

## 2023-03-07 NOTE — Assessment & Plan Note (Signed)
Calcium seen in the proximal and mid LAD proximal L circumflex. On ASA, Crestor 

## 2023-03-07 NOTE — Assessment & Plan Note (Signed)
Loose wt Walk daily RTC w/A1c in 3 mo

## 2023-03-07 NOTE — Progress Notes (Signed)
Subjective:  Patient ID: Shawn Orozco, male    DOB: March 20, 1946  Age: 77 y.o. MRN: [id number]  CC: No chief complaint on file.   HPI Lomax Centola presents for a 6 months f/u F/u prostate cancer, CAD  Outpatient Medications Prior to Visit  Medication Sig Dispense Refill   cholecalciferol (VITAMIN D3) 25 MCG (1000 UNIT) tablet Take 1,000 Units by mouth daily.     Hypertonic Nasal Wash (SINUS RINSE) PACK Place into the nose as needed.     ketoconazole (NIZORAL) 2 % cream APPLY A SMALL AMOUNT TOPICALLY BETWEEN THE TOES AND TO AFFECTED AREA(S) TWO TIMES A DAY AS NEEDED     loratadine (CLARITIN) 10 MG tablet Take 1 tablet (10 mg total) by mouth daily. 30 tablet 11   Niacinamide 500 MG TBCR Take 500 mg by mouth daily.     omeprazole (PRILOSEC) 20 MG capsule Take 1 capsule (20 mg total) by mouth daily. 90 capsule 3   rosuvastatin (CRESTOR) 20 MG tablet Take 1 tablet (20 mg total) by mouth daily. 90 tablet 3   No facility-administered medications prior to visit.    ROS: Review of Systems  Constitutional:  Negative for appetite change, fatigue and unexpected weight change.  HENT:  Negative for congestion, nosebleeds, sneezing, sore throat and trouble swallowing.   Eyes:  Negative for itching and visual disturbance.  Respiratory:  Negative for cough.   Cardiovascular:  Negative for chest pain, palpitations and leg swelling.  Gastrointestinal:  Negative for abdominal distention, blood in stool, diarrhea and nausea.  Genitourinary:  Negative for frequency and hematuria.  Musculoskeletal:  Negative for back pain, gait problem, joint swelling and neck pain.  Skin:  Negative for rash.  Neurological:  Negative for dizziness, tremors, speech difficulty and weakness.  Psychiatric/Behavioral:  Negative for agitation, dysphoric mood and sleep disturbance. The patient is not nervous/anxious.     Objective:  BP 130/70 (BP Location: Left Arm, Patient Position: Sitting, Cuff Size: Large)   Pulse 76    Temp 98.3 F (36.8 C) (Oral)   Ht 6' (1.829 m)   Wt 195 lb (88.5 kg)   SpO2 94%   BMI 26.45 kg/m   BP Readings from Last 3 Encounters:  03/07/23 130/70  10/13/22 128/82  09/02/22 122/60    Wt Readings from Last 3 Encounters:  03/07/23 195 lb (88.5 kg)  10/13/22 186 lb 12.8 oz (84.7 kg)  09/02/22 190 lb (86.2 kg)    Physical Exam Constitutional:      General: He is not in acute distress.    Appearance: He is well-developed. He is obese. He is not diaphoretic.     Comments: NAD  HENT:     Head: Normocephalic and atraumatic.     Right Ear: External ear normal.     Left Ear: External ear normal.     Nose: Nose normal.     Mouth/Throat:     Pharynx: No oropharyngeal exudate.  Eyes:     General: No scleral icterus.       Right eye: No discharge.        Left eye: No discharge.     Conjunctiva/sclera: Conjunctivae normal.     Pupils: Pupils are equal, round, and reactive to light.  Neck:     Thyroid: No thyromegaly.     Vascular: No JVD.     Trachea: No tracheal deviation.  Cardiovascular:     Rate and Rhythm: Normal rate and regular rhythm.     Heart sounds:  Normal heart sounds. No murmur heard.    No friction rub. No gallop.  Pulmonary:     Effort: Pulmonary effort is normal. No respiratory distress.     Breath sounds: Normal breath sounds. No stridor. No wheezing or rales.  Chest:     Chest wall: No tenderness.  Abdominal:     General: Bowel sounds are normal. There is no distension.     Palpations: Abdomen is soft. There is no mass.     Tenderness: There is no abdominal tenderness. There is no guarding or rebound.  Genitourinary:    Penis: Normal. No tenderness.      Prostate: Normal.     Rectum: Normal. Guaiac result negative.  Musculoskeletal:        General: No tenderness. Normal range of motion.     Cervical back: Normal range of motion and neck supple.  Lymphadenopathy:     Cervical: No cervical adenopathy.  Skin:    General: Skin is warm and dry.      Coloration: Skin is not pale.     Findings: No erythema or rash.  Neurological:     Mental Status: He is alert and oriented to person, place, and time.     Cranial Nerves: No cranial nerve deficit.     Motor: No abnormal muscle tone.     Coordination: Coordination normal.     Gait: Gait normal.     Deep Tendon Reflexes: Reflexes are normal and symmetric. Reflexes normal.  Psychiatric:        Behavior: Behavior normal.        Thought Content: Thought content normal.        Judgment: Judgment normal.     Lab Results  Component Value Date   WBC 7.5 03/03/2023   HGB 15.8 03/03/2023   HCT 45.3 03/03/2023   PLT 217.0 03/03/2023   GLUCOSE 133 (H) 03/03/2023   CHOL 144 03/03/2023   TRIG 177.0 (H) 03/03/2023   HDL 40.90 03/03/2023   LDLCALC 68 03/03/2023   ALT 13 03/03/2023   AST 16 03/03/2023   NA 139 03/03/2023   K 4.3 03/03/2023   CL 104 03/03/2023   CREATININE 1.42 03/03/2023   BUN 22 03/03/2023   CO2 29 03/03/2023   TSH 3.44 03/03/2023   PSA 0.07 (L) 08/26/2022    CT Head Wo Contrast  Result Date: 03/22/2022 CLINICAL DATA:  Syncopal episode getting out of bed this morning. Fall with facial laceration in the right eyebrow region. Nose bleed. EXAM: CT HEAD WITHOUT CONTRAST CT MAXILLOFACIAL WITHOUT CONTRAST TECHNIQUE: Multidetector CT imaging of the head and maxillofacial structures were performed using the standard protocol without intravenous contrast. Multiplanar CT image reconstructions of the maxillofacial structures were also generated. RADIATION DOSE REDUCTION: This exam was performed according to the departmental dose-optimization program which includes automated exposure control, adjustment of the mA and/or kV according to patient size and/or use of iterative reconstruction technique. COMPARISON:  None. FINDINGS: CT HEAD FINDINGS Brain: There is no evidence for acute hemorrhage, hydrocephalus, mass lesion, or abnormal extra-axial fluid collection. No definite CT evidence  for acute infarction. Vascular: No hyperdense vessel or unexpected calcification. Skull: No evidence for fracture. No worrisome lytic or sclerotic lesion. Other: None. CT MAXILLOFACIAL FINDINGS Osseous: Minimally displaced nasal bone fractures evident. No other acute facial bone fracture evident. No mandibular fracture or dislocation. No destructive process. Orbits: Negative. No traumatic or inflammatory finding. Sinuses: Surgical changes are noted in the maxillary sinuses bilaterally chronic mucosal disease is  seen in the ethmoid and frontal sinuses. Air-fluid level noted left sphenoid sinus and left maxillary sinus. Soft tissues: Edema and skin irregularity in the right supraorbital scalp is compatible with the reported history of laceration. IMPRESSION: 1. No acute intracranial abnormality. 2. Minimally displaced nasal bone fractures. No other maxillofacial fracture on the current study. 3. 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 Air-fluid levels may be related to the reported history of nose bleed. Electronically Signed   By: Kennith Center M.D.   On: 03/22/2022 07:48   CT Maxillofacial Wo Contrast  Result Date: 03/22/2022 CLINICAL DATA:  Syncopal episode getting out of bed this morning. Fall with facial laceration in the right eyebrow region. Nose bleed. EXAM: CT HEAD WITHOUT CONTRAST CT MAXILLOFACIAL WITHOUT CONTRAST TECHNIQUE: Multidetector CT imaging of the head and maxillofacial structures were performed using the standard protocol without intravenous contrast. Multiplanar CT image reconstructions of the maxillofacial structures were also generated. RADIATION DOSE REDUCTION: This exam was performed according to the departmental dose-optimization program which includes automated exposure control, adjustment of the mA and/or kV according to patient size and/or use of iterative reconstruction technique. COMPARISON:  None. FINDINGS: CT HEAD  FINDINGS Brain: There is no evidence for acute hemorrhage, hydrocephalus, mass lesion, or abnormal extra-axial fluid collection. No definite CT evidence for acute infarction. Vascular: No hyperdense vessel or unexpected calcification. Skull: No evidence for fracture. No worrisome lytic or sclerotic lesion. Other: None. CT MAXILLOFACIAL FINDINGS Osseous: Minimally displaced nasal bone fractures evident. No other acute facial bone fracture evident. No mandibular fracture or dislocation. No destructive process. Orbits: Negative. No traumatic or inflammatory finding. Sinuses: Surgical changes are noted in the maxillary sinuses bilaterally chronic mucosal disease is seen in the ethmoid and frontal sinuses. Air-fluid level noted left sphenoid sinus and left maxillary sinus. Soft tissues: Edema and skin irregularity in the right supraorbital scalp is compatible with the reported history of laceration. IMPRESSION: 1. No acute intracranial abnormality. 2. Minimally displaced nasal bone fractures. No other maxillofacial fracture on the current study. 3. 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 Air-fluid levels may be related to the reported history of nose bleed. Electronically Signed   By: Kennith Center M.D.   On: 03/22/2022 07:48   DG Chest Portable 1 View  Result Date: 03/22/2022 CLINICAL DATA:  Syncope this morning EXAM: PORTABLE CHEST 1 VIEW COMPARISON:  02/01/2022 FINDINGS: Normal heart size. Negative mediastinal contours. There is distortion from rightward rotation. Borderline atelectasis at the bases. There is no edema, consolidation, effusion, or pneumothorax. IMPRESSION: No evidence of active disease. Electronically Signed   By: Tiburcio Pea M.D.   On: 03/22/2022 07:36    Assessment & Plan:   Problem List Items Addressed This Visit       Cardiovascular and Mediastinum   Coronary atherosclerosis    Calcium seen in the proximal and mid LAD  proximal L circumflex. On ASA, Crestor        Genitourinary   Chronic renal impairment, stage 3a    GFR 47.7 Hydrate better RTC w/A1c in 3 mo PET CT is pending      Relevant Orders   Comprehensive metabolic panel   Prostate cancer    F/u w/Dr Benancio Deeds  2024 PSA 0.16 (doubled)  PET CT is pending        Other   Dyslipidemia - Primary    Calcium seen in the proximal and mid  LAD proximal L circumflex. On ASA, Crestor      Hyperglycemia    Loose wt Walk daily RTC w/A1c in 3 mo      Relevant Orders   Hemoglobin A1c      No orders of the defined types were placed in this encounter.     Follow-up: Return in about 3 months (around 06/06/2023) for a follow-up visit.  Sonda Primes, MD

## 2023-03-08 ENCOUNTER — Other Ambulatory Visit (HOSPITAL_COMMUNITY): Payer: Self-pay | Admitting: Urology

## 2023-03-08 DIAGNOSIS — R9721 Rising PSA following treatment for malignant neoplasm of prostate: Secondary | ICD-10-CM

## 2023-03-08 DIAGNOSIS — C61 Malignant neoplasm of prostate: Secondary | ICD-10-CM

## 2023-03-21 ENCOUNTER — Encounter (HOSPITAL_COMMUNITY)
Admission: RE | Admit: 2023-03-21 | Discharge: 2023-03-21 | Disposition: A | Discharge status: Home / Self Care | Payer: Medicare Other | Source: Ambulatory Visit | Attending: Urology | Admitting: Urology

## 2023-03-21 DIAGNOSIS — R9721 Rising PSA following treatment for malignant neoplasm of prostate: Secondary | ICD-10-CM | POA: Diagnosis present

## 2023-03-21 DIAGNOSIS — C61 Malignant neoplasm of prostate: Secondary | ICD-10-CM | POA: Diagnosis present

## 2023-03-21 MED ORDER — PIFLIFOLASTAT F 18 (PYLARIFY) INJECTION
9.0000 | Freq: Once | INTRAVENOUS | Status: AC
  Administered 2023-03-21: 9.5 via INTRAVENOUS

## 2023-04-07 ENCOUNTER — Ambulatory Visit: Payer: Medicare Other | Attending: Internal Medicine

## 2023-04-07 DIAGNOSIS — E785 Hyperlipidemia, unspecified: Secondary | ICD-10-CM

## 2023-04-07 DIAGNOSIS — I2510 Atherosclerotic heart disease of native coronary artery without angina pectoris: Secondary | ICD-10-CM

## 2023-04-08 LAB — LIPID PANEL
Chol/HDL Ratio: 3.2 ratio (ref 0.0–5.0)
Cholesterol, Total: 132 mg/dL (ref 100–199)
HDL: 41 mg/dL (ref >39)
LDL Chol Calc (NIH): 71 mg/dL (ref 0–99)
Triglycerides: 108 mg/dL (ref 0–149)
VLDL Cholesterol Cal: 20 mg/dL (ref 5–40)

## 2023-04-08 LAB — ALT: ALT: 15 IU/L (ref 0–44)

## 2023-04-08 IMAGING — DX DG LUMBAR SPINE 2-3V
3 series · 3 of 3 positions shown · non-contrast
Comparison: None Available.

CLINICAL DATA: Low back pain for 3 weeks related to
slipping/twisting wrong.

EXAM:
LUMBAR SPINE - 2-3 VIEW

[l-spine ap]
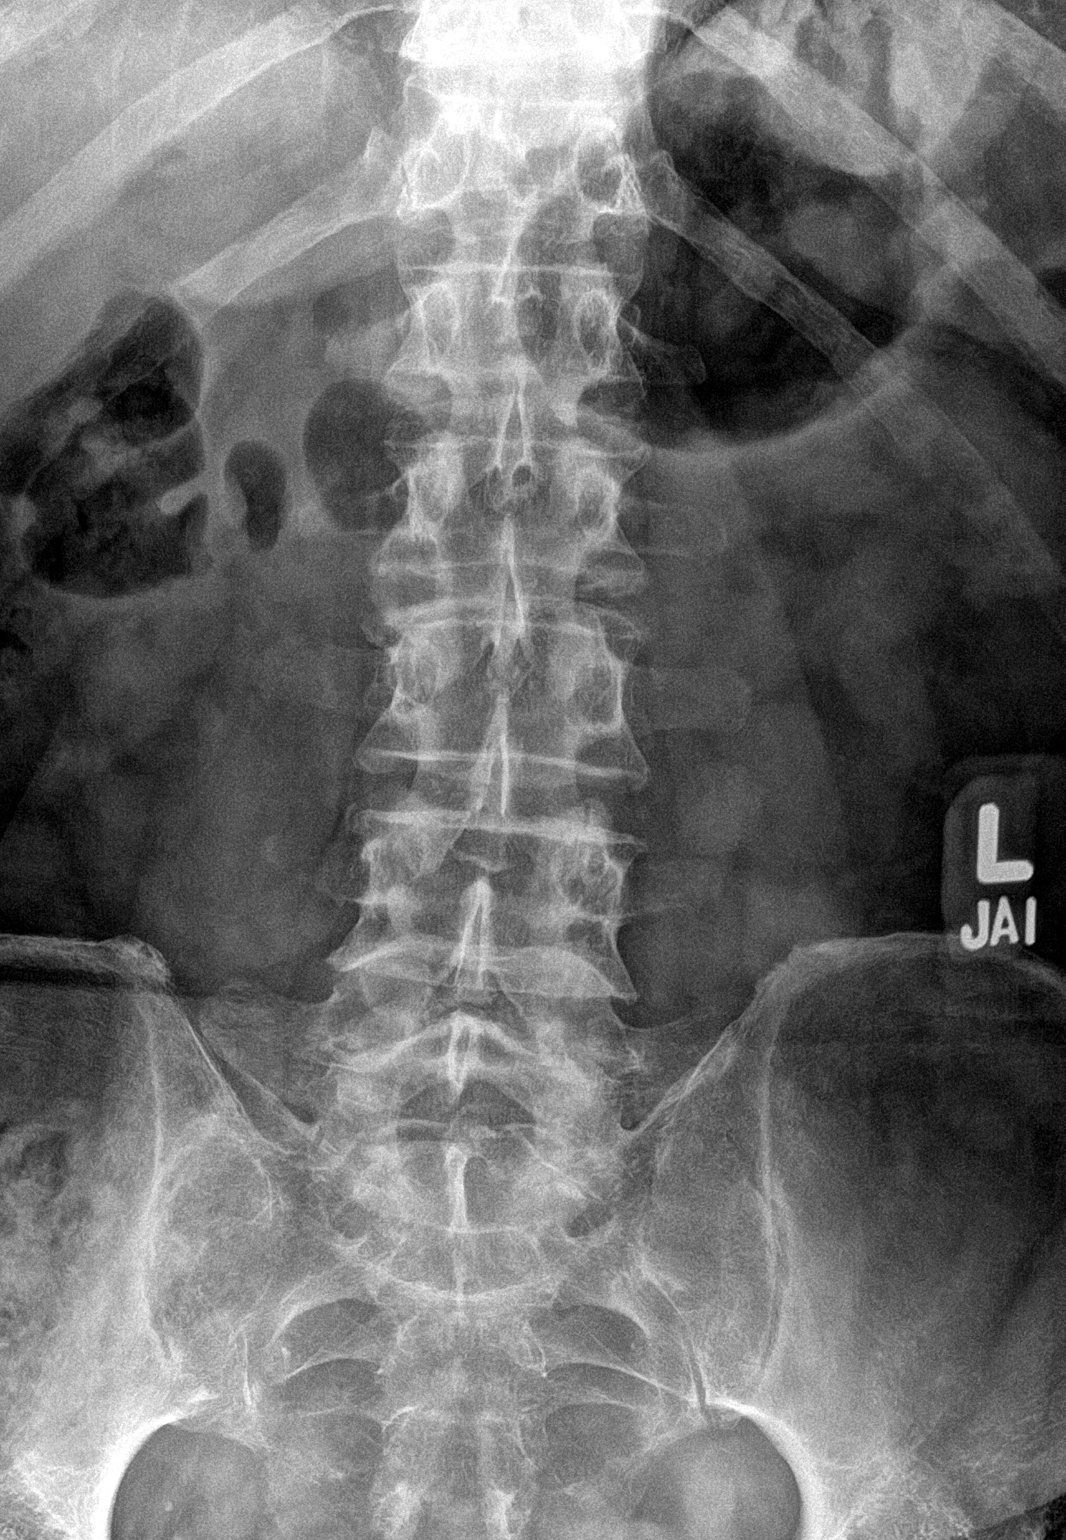

[l-spine lateral]
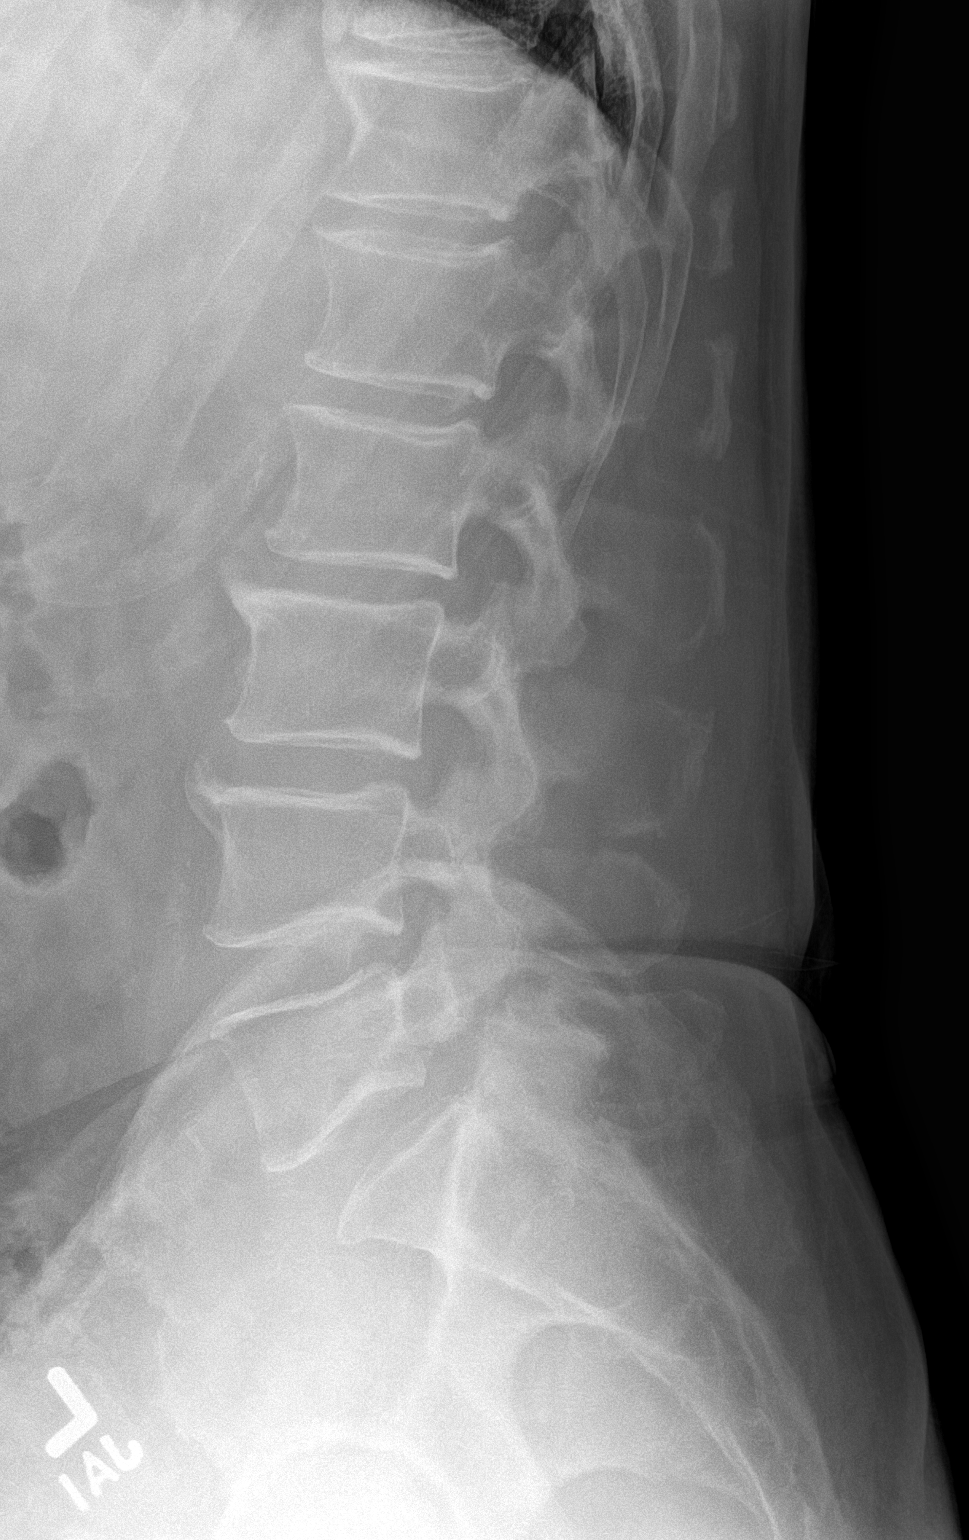

[l-spine spot]
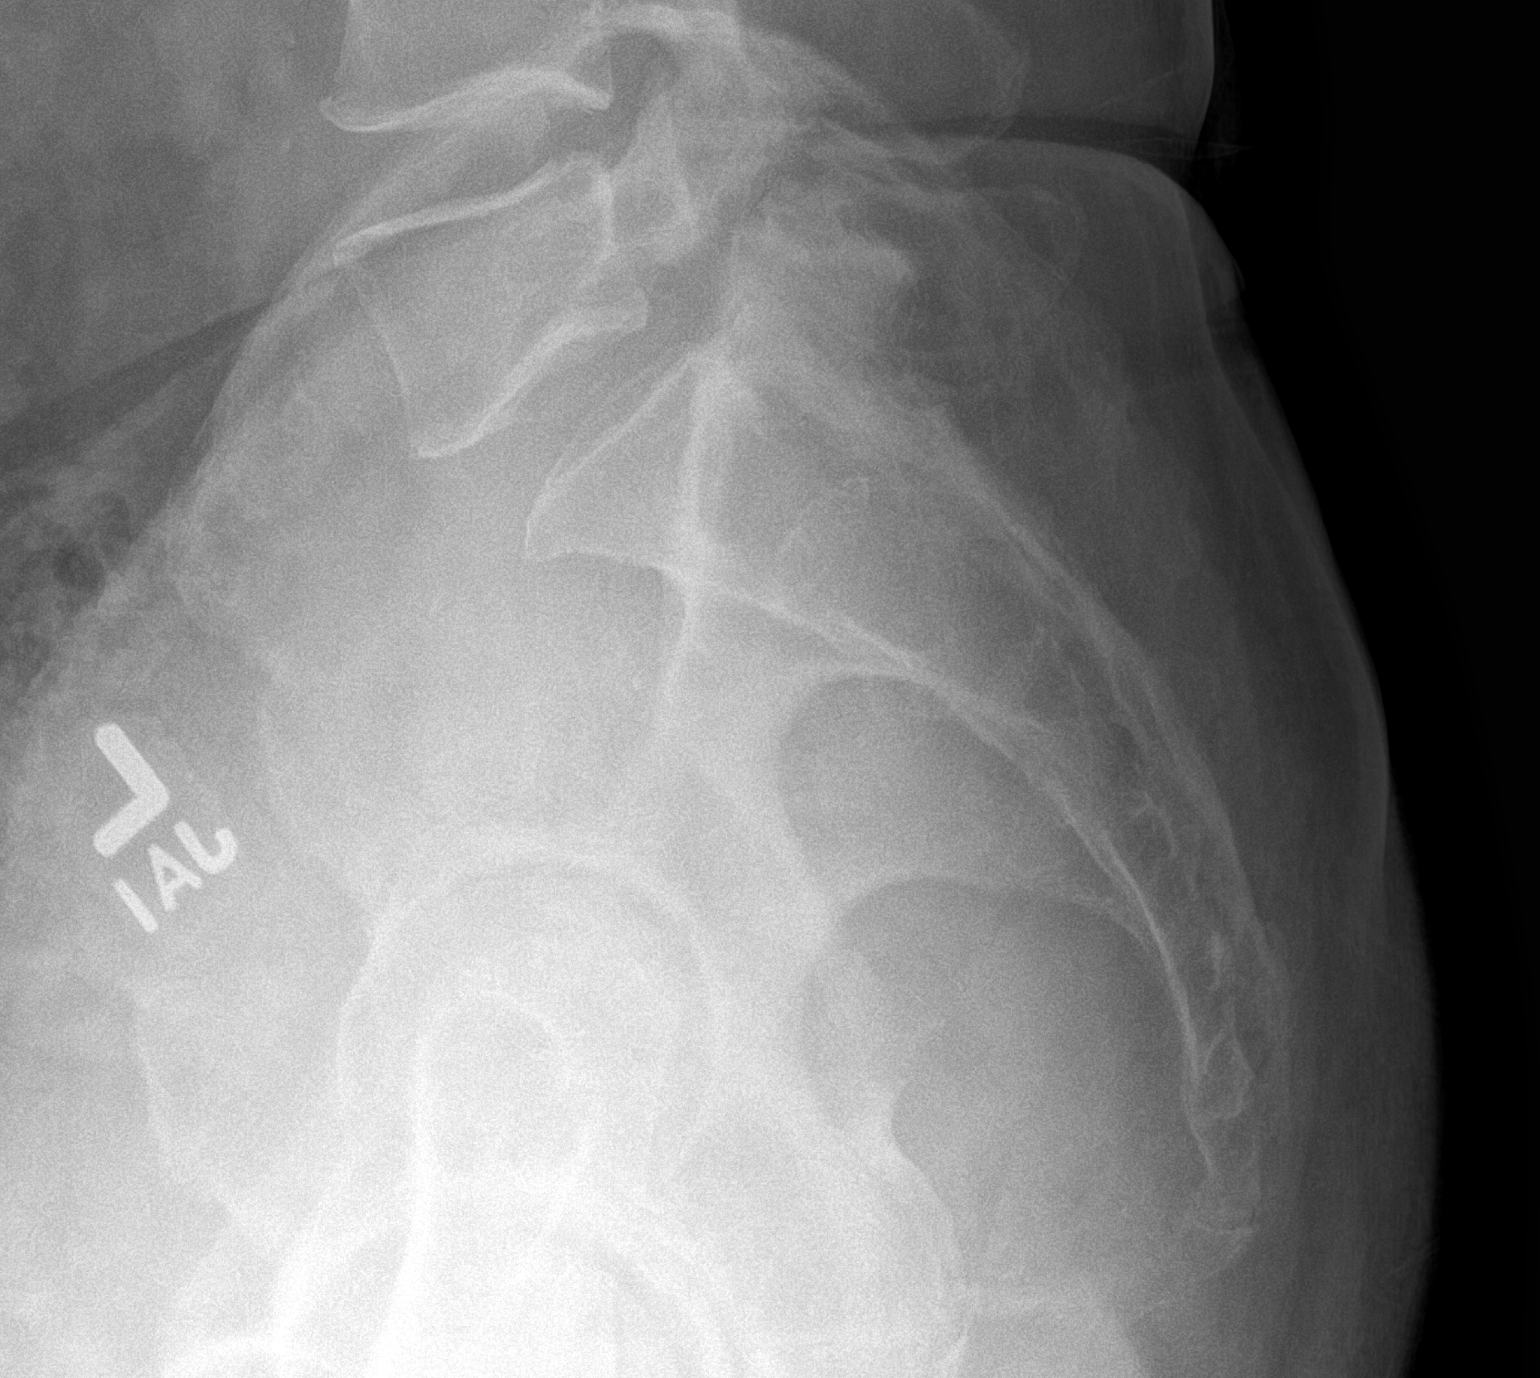

[3 of 3 positions shown; findings below may reference images not displayed]

FINDINGS: There are 5 non-rib-bearing lumbar-type vertebral bodies. 4 mm grade
1 anterolisthesis of L5 on S1.

Mild-to-moderate anterior L3-4 and mild anterior L2-3 endplate
osteophytes. Mild anterior greater than posterior T11-12 disc space
narrowing with moderate anterior endplate osteophytes.

Probable bilateral L5 pars defects.
IMPRESSION: 1. Grade 1 anterolisthesis of L5 on S1. Probable bilateral L5 pars
defects.
2. Mild T11-12 disc space narrowing.

## 2023-04-08 IMAGING — DX DG PELVIS 1-2V
1 series · 1 of 1 positions shown · non-contrast
Comparison: None Available.

CLINICAL DATA: Lower back pain for 3 weeks following
pulling/slipping/twisting injury.

EXAM:
PELVIS - 1-2 VIEW

[pelvis ap]
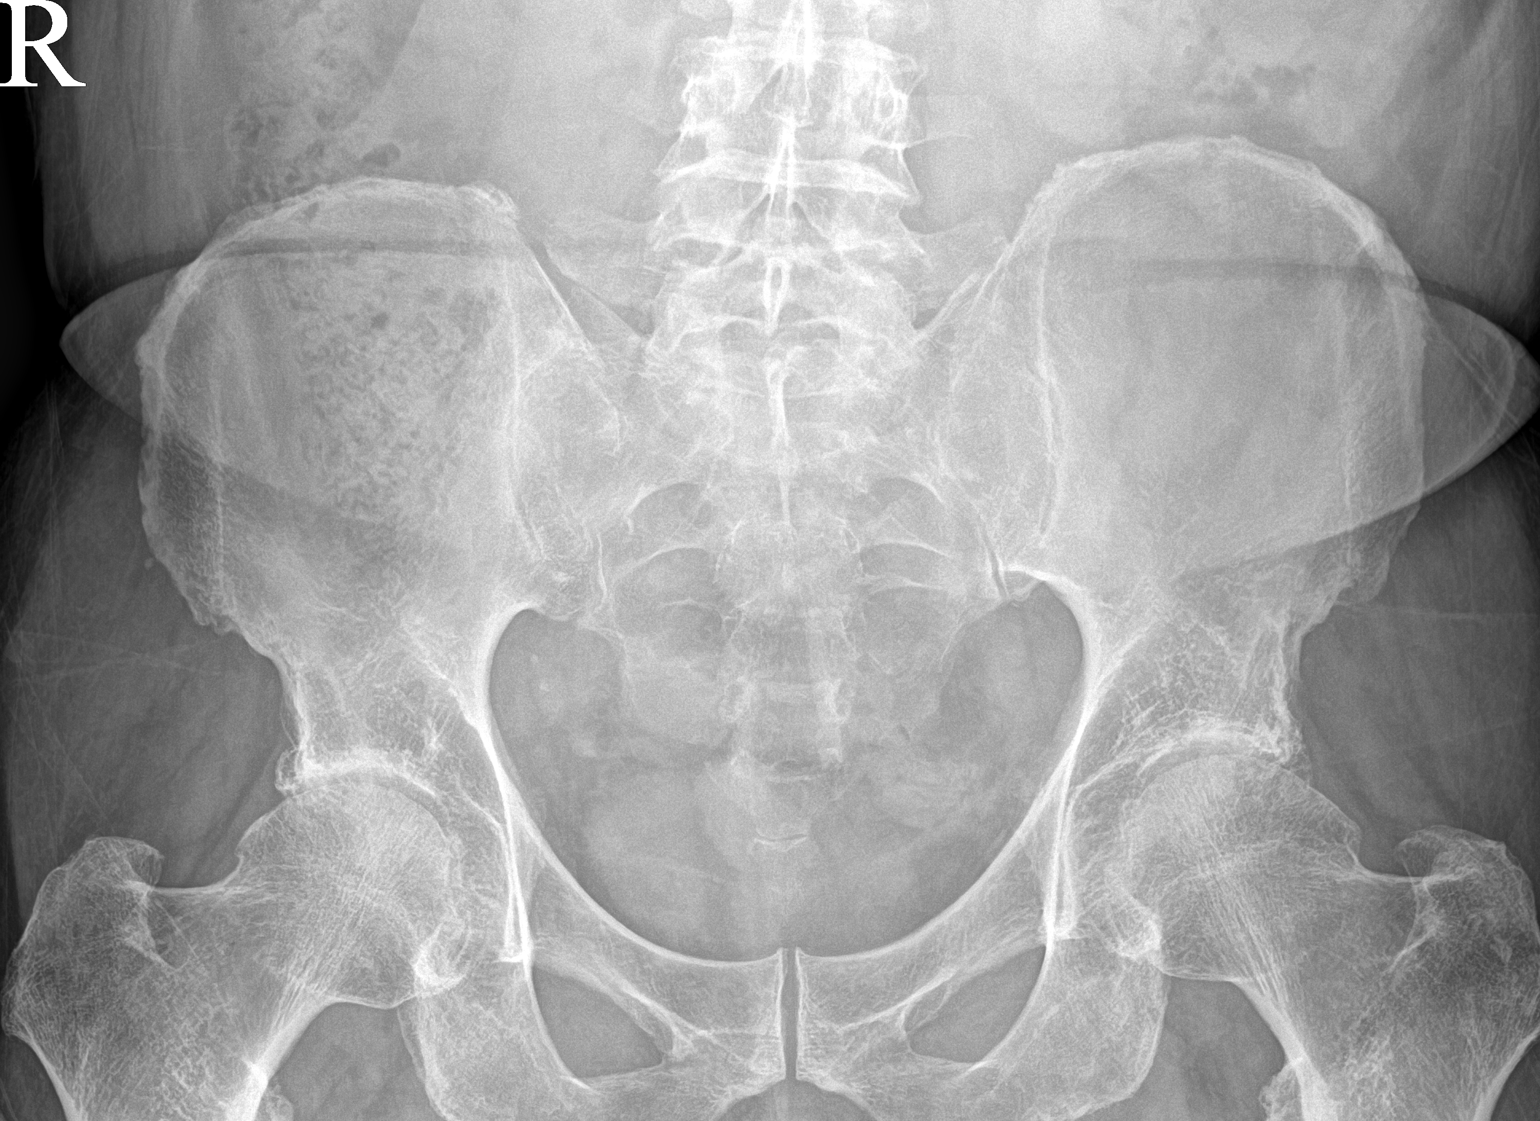

[1 of 1 positions shown; findings below may reference images not displayed]

FINDINGS: Mildly decreased bone mineralization. Mild bilateral sacroiliac
joint space narrowing and subchondral sclerosis.

Moderate to severe bilateral superior femoroacetabular joint space
narrowing. Moderate bilateral femoral head-neck junction peripheral
degenerative osteophytosis. Minimal superior pubic symphysis
degenerative spurring.

No acute fracture or dislocation.
IMPRESSION: Moderate bilateral femoroacetabular osteoarthritis.

## 2023-06-06 ENCOUNTER — Encounter: Payer: Self-pay | Admitting: Internal Medicine

## 2023-06-06 ENCOUNTER — Ambulatory Visit: Payer: Medicare Other | Admitting: Internal Medicine

## 2023-06-06 VITALS — BP 118/70 | HR 60 | Temp 98.0°F | Ht 72.0 in | Wt 190.0 lb

## 2023-06-06 DIAGNOSIS — E118 Type 2 diabetes mellitus with unspecified complications: Secondary | ICD-10-CM

## 2023-06-06 DIAGNOSIS — E785 Hyperlipidemia, unspecified: Secondary | ICD-10-CM | POA: Diagnosis not present

## 2023-06-06 DIAGNOSIS — N1831 Chronic kidney disease, stage 3a: Secondary | ICD-10-CM | POA: Diagnosis not present

## 2023-06-06 DIAGNOSIS — C61 Malignant neoplasm of prostate: Secondary | ICD-10-CM | POA: Diagnosis not present

## 2023-06-06 DIAGNOSIS — R739 Hyperglycemia, unspecified: Secondary | ICD-10-CM

## 2023-06-06 LAB — COMPREHENSIVE METABOLIC PANEL
ALT: 13 U/L (ref 0–53)
AST: 16 U/L (ref 0–37)
Albumin: 4.2 g/dL (ref 3.5–5.2)
Alkaline Phosphatase: 68 U/L (ref 39–117)
BUN: 21 mg/dL (ref 6–23)
CO2: 27 mEq/L (ref 19–32)
Calcium: 9.5 mg/dL (ref 8.4–10.5)
Chloride: 102 mEq/L (ref 96–112)
Creatinine, Ser: 1.41 mg/dL (ref 0.40–1.50)
GFR: 48.04 mL/min — ABNORMAL LOW (ref >60.00)
Glucose, Bld: 137 mg/dL — ABNORMAL HIGH (ref 70–99)
Potassium: 4 mEq/L (ref 3.5–5.1)
Sodium: 137 mEq/L (ref 135–145)
Total Bilirubin: 2.5 mg/dL — ABNORMAL HIGH (ref 0.2–1.2)
Total Protein: 7.1 g/dL (ref 6.0–8.3)

## 2023-06-06 LAB — HEMOGLOBIN A1C: Hgb A1c MFr Bld: 7.1 % — ABNORMAL HIGH (ref 4.6–6.5)

## 2023-06-06 NOTE — Assessment & Plan Note (Signed)
Lost wt Walks 2 mi daily RTC w/A1c in 3-6 mo Start Farxiga 5 mg daily if tolerated

## 2023-06-06 NOTE — Assessment & Plan Note (Signed)
On ASA, Crestor 

## 2023-06-06 NOTE — Addendum Note (Signed)
Addended by: Tresa Garter on: 06/06/2023 08:38 AM   Modules accepted: Orders

## 2023-06-06 NOTE — Progress Notes (Signed)
Subjective:  Patient ID: Shawn Orozco, male    DOB: May 11, 1946  Age: 77 y.o. MRN: [id number]  CC: Follow-up (3 [facility] F/U)   HPI Carthel Castille presents for GERD, dyslipidemia, allergies, prostate cancer  Outpatient Medications Prior to Visit  Medication Sig Dispense Refill   cholecalciferol (VITAMIN D3) 25 MCG (1000 UNIT) tablet Take 1,000 Units by mouth daily.     Hypertonic Nasal Wash (SINUS RINSE) PACK Place into the nose as needed.     ketoconazole (NIZORAL) 2 % cream APPLY A SMALL AMOUNT TOPICALLY BETWEEN THE TOES AND TO AFFECTED AREA(S) TWO TIMES A DAY AS NEEDED     loratadine (CLARITIN) 10 MG tablet Take 1 tablet (10 mg total) by mouth daily. 30 tablet 11   Niacinamide 500 MG TBCR Take 500 mg by mouth daily.     omeprazole (PRILOSEC) 20 MG capsule Take 1 capsule (20 mg total) by mouth daily. 90 capsule 3   rosuvastatin (CRESTOR) 20 MG tablet Take 1 tablet (20 mg total) by mouth daily. 90 tablet 3   No facility-administered medications prior to visit.    ROS: Review of Systems  Constitutional:  Negative for appetite change, fatigue and unexpected weight change.  HENT:  Negative for congestion, nosebleeds, sneezing, sore throat and trouble swallowing.   Eyes:  Negative for itching and visual disturbance.  Respiratory:  Negative for cough.   Cardiovascular:  Negative for chest pain, palpitations and leg swelling.  Gastrointestinal:  Negative for abdominal distention, blood in stool, diarrhea and nausea.  Genitourinary:  Negative for frequency and hematuria.  Musculoskeletal:  Negative for back pain, gait problem, joint swelling and neck pain.  Skin:  Negative for rash.  Neurological:  Negative for dizziness, tremors, speech difficulty and weakness.  Psychiatric/Behavioral:  Negative for agitation, dysphoric mood and sleep disturbance. The patient is not nervous/anxious.     Objective:  BP 118/70 (BP Location: Left Arm, Patient Position: Sitting, Cuff Size: Large)   Pulse 60    Temp 98 F (36.7 C) (Oral)   Ht 6' (1.829 m)   Wt 190 lb (86.2 kg)   SpO2 96%   BMI 25.77 kg/m   BP Readings from Last 3 Encounters:  06/06/23 118/70  03/07/23 130/70  10/13/22 128/82    Wt Readings from Last 3 Encounters:  06/06/23 190 lb (86.2 kg)  03/07/23 195 lb (88.5 kg)  10/13/22 186 lb 12.8 oz (84.7 kg)    Physical Exam Constitutional:      General: He is not in acute distress.    Appearance: He is well-developed.     Comments: NAD  Eyes:     Conjunctiva/sclera: Conjunctivae normal.     Pupils: Pupils are equal, round, and reactive to light.  Neck:     Thyroid: No thyromegaly.     Vascular: No JVD.  Cardiovascular:     Rate and Rhythm: Normal rate and regular rhythm.     Heart sounds: Normal heart sounds. No murmur heard.    No friction rub. No gallop.  Pulmonary:     Effort: Pulmonary effort is normal. No respiratory distress.     Breath sounds: Normal breath sounds. No wheezing or rales.  Chest:     Chest wall: No tenderness.  Abdominal:     General: Bowel sounds are normal. There is no distension.     Palpations: Abdomen is soft. There is no mass.     Tenderness: There is no abdominal tenderness. There is no guarding or rebound.  Musculoskeletal:  General: No tenderness. Normal range of motion.     Cervical back: Normal range of motion.  Lymphadenopathy:     Cervical: No cervical adenopathy.  Skin:    General: Skin is warm and dry.     Findings: No rash.  Neurological:     Mental Status: He is alert and oriented to person, place, and time.     Cranial Nerves: No cranial nerve deficit.     Motor: No abnormal muscle tone.     Coordination: Coordination normal.     Gait: Gait normal.     Deep Tendon Reflexes: Reflexes are normal and symmetric.  Psychiatric:        Behavior: Behavior normal.        Thought Content: Thought content normal.        Judgment: Judgment normal.     Lab Results  Component Value Date   WBC 7.5 03/03/2023    HGB 15.8 03/03/2023   HCT 45.3 03/03/2023   PLT 217.0 03/03/2023   GLUCOSE 133 (H) 03/03/2023   CHOL 132 04/07/2023   TRIG 108 04/07/2023   HDL 41 04/07/2023   LDLCALC 71 04/07/2023   ALT 15 04/07/2023   AST 16 03/03/2023   NA 139 03/03/2023   K 4.3 03/03/2023   CL 104 03/03/2023   CREATININE 1.42 03/03/2023   BUN 22 03/03/2023   CO2 29 03/03/2023   TSH 3.44 03/03/2023   PSA 0.07 (L) 08/26/2022    NM PET (PSMA) SKULL TO MID THIGH  Result Date: 03/23/2023 CLINICAL DATA:  Prostate carcinoma with biochemical recurrence. EXAM: NUCLEAR MEDICINE PET SKULL BASE TO THIGH TECHNIQUE: 9.5 mCi F18 Piflufolastat (Pylarify) was injected intravenously. Full-ring PET imaging was performed from the skull base to thigh after the radiotracer. CT data was obtained and used for attenuation correction and anatomic localization. COMPARISON:  None Available. FINDINGS: NECK No radiotracer activity in neck lymph nodes. Incidental CT finding: None. CHEST No radiotracer accumulation within mediastinal or hilar lymph nodes. No suspicious pulmonary nodules on the CT scan. Incidental CT finding: None. ABDOMEN/PELVIS Prostate: No focal activity in the prostate bed. Lymph nodes: No abnormal radiotracer accumulation within pelvic or abdominal nodes. Liver: No evidence of liver metastasis. 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 SKELETON No focal activity to suggest skeletal metastasis. No sclerotic or lytic lesions on CT imaging. IMPRESSION: 1. No evidence of local prostate cancer recurrence in the prostatectomy bed. 2. No evidence of metastatic adenopathy in the pelvis or periaortic retroperitoneum. 3. No evidence of visceral metastasis or skeletal metastasis. Electronically Signed   By: Genevive Bi M.D.   On: 03/23/2023 16:08    Assessment & Plan:   Problem List Items Addressed This Visit     Prostate cancer [facility])    PET CT (-) 03/2023       Dyslipidemia - Primary    On ASA, Crestor      Chronic renal impairment, stage 3a (HCC)    GFR 47.7 Hydrate better RTC w/A1c in 3 mo PET CT      Hyperglycemia    Lost wt Walks 2 mi daily RTC w/A1c in 3-6 mo         No orders of the defined types were placed in this encounter.     Follow-up: Return in about 6 months (around 12/07/2023) for a follow-up visit.  Sonda Primes, MD

## 2023-06-06 NOTE — Assessment & Plan Note (Signed)
GFR 47.7 Hydrate better Start Farxiga 5 mg daily if tolerated

## 2023-06-06 NOTE — Assessment & Plan Note (Signed)
PET CT (-) 03/2023

## 2023-06-07 DIAGNOSIS — E119 Type 2 diabetes mellitus without complications: Secondary | ICD-10-CM | POA: Insufficient documentation

## 2023-06-07 MED ORDER — DAPAGLIFLOZIN PROPANEDIOL 5 MG PO TABS: 5.0000 mg | ORAL_TABLET | Freq: Every day | ORAL | 5 refills | Status: DC

## 2023-06-07 NOTE — Addendum Note (Signed)
Addended by: Tresa Garter on: 06/07/2023 07:53 AM   Modules accepted: Orders

## 2023-07-12 ENCOUNTER — Telehealth: Payer: Self-pay | Admitting: Gastroenterology

## 2023-07-12 MED ORDER — OMEPRAZOLE 20 MG PO CPDR: 20.0000 mg | DELAYED_RELEASE_CAPSULE | Freq: Every day | ORAL | 0 refills | Status: DC

## 2023-07-12 NOTE — Telephone Encounter (Signed)
Script sent to pharmacy.

## 2023-07-12 NOTE — Telephone Encounter (Signed)
Patient called to request a refill on Omeprazole. 

## 2023-08-09 ENCOUNTER — Ambulatory Visit (INDEPENDENT_AMBULATORY_CARE_PROVIDER_SITE_OTHER): Payer: Medicare Other

## 2023-08-09 VITALS — Ht 72.0 in | Wt 179.0 lb

## 2023-08-09 DIAGNOSIS — Z0000 Encounter for general adult medical examination without abnormal findings: Secondary | ICD-10-CM

## 2023-08-09 NOTE — Progress Notes (Addendum)
Subjective:   Shawn Orozco is a 77 y.o. male who presents for Medicare Annual/Subsequent preventive examination.  Visit Complete: Virtual  I connected with  Marene Lenz on 08/09/23 by a audio enabled telemedicine application and verified that I am speaking with the correct person using two identifiers.  Patient Location: Home  Provider Location: Office/Clinic  I discussed the limitations of evaluation and management by telemedicine. The patient expressed understanding and agreed to proceed.  Patient Medicare AWV questionnaire was completed by the patient on 08/05/2023; I have confirmed that all information answered by patient is correct and no changes since this date.  Patient provided his/her weight during this virtual phone visit.  Vital Signs: Because this visit was a virtual/telehealth visit, some criteria may be missing or patient reported. Any vitals not documented were not able to be obtained and vitals that have been documented are patient reported.    Cardiac Risk Factors include: advanced age (>26men, >47 women);diabetes mellitus;dyslipidemia;family history of premature cardiovascular disease;male gender     Objective:    Today's Vitals   08/09/23 0849 08/09/23 0850  Weight: 179 lb (81.2 kg)   Height: 6' (1.829 m)   PainSc: 0-No pain 0-No pain   Body mass index is 24.28 kg/m.     08/09/2023    8:53 AM 08/04/2022   10:50 AM 03/22/2022    7:11 AM 08/03/2021    1:43 PM  Advanced Directives  Does Patient Have a Medical Advance Directive? Yes Yes Yes Yes  Type of Estate agent of [place];Living will Healthcare Power of [place];Living will Healthcare Power of [place];Living will Living will;Healthcare Power of Attorney  Does patient want to make changes to medical advance directive?    No - Patient declined  Copy of Healthcare Power of Attorney in Chart? No - copy requested No - copy requested  No - copy requested    Current Medications  (verified) Outpatient Encounter Medications as of 08/09/2023  Medication Sig   cholecalciferol (VITAMIN D3) 25 MCG (1000 UNIT) tablet Take 1,000 Units by mouth daily.   Hypertonic Nasal Wash (SINUS RINSE) PACK Place into the nose as needed.   ketoconazole (NIZORAL) 2 % cream APPLY A SMALL AMOUNT TOPICALLY BETWEEN THE TOES AND TO AFFECTED AREA(S) TWO TIMES A DAY AS NEEDED   loratadine (CLARITIN) 10 MG tablet Take 1 tablet (10 mg total) by mouth daily.   Niacinamide 500 MG TBCR Take 500 mg by mouth daily.   omeprazole (PRILOSEC) 20 MG capsule Take 1 capsule (20 mg total) by mouth daily.   rosuvastatin (CRESTOR) 20 MG tablet Take 1 tablet (20 mg total) by mouth daily.   dapagliflozin propanediol (FARXIGA) 5 MG TABS tablet Take 1 tablet (5 mg total) by mouth daily before breakfast. (Patient not taking: Reported on 08/09/2023)   No facility-administered encounter medications on file as of 08/09/2023.    Allergies (verified) Prednisone   History: Past Medical History:  Diagnosis Date   Arthritis    Cataract    removed bilat 2014 with lens implants    Colon polyps    Deviated septum    GERD (gastroesophageal reflux disease)    Hyperlipidemia    preventative for FH    Prostate cancer (HCC) 2021   04-14-2020 prostate surgery    Past Surgical History:  Procedure Laterality Date   CATARACT EXTRACTION, BILATERAL     COLONOSCOPY     KNEE ARTHROSCOPY Right    NASAL SEPTUM SURGERY     POLYPECTOMY  PROSTATECTOMY  04/14/2020   SHOULDER ARTHROSCOPY Right    TONSILLECTOMY     Family History  Problem Relation Age of Onset   Alzheimer's disease Mother    Alzheimer's disease Brother    Heart disease Brother    Heart disease Brother    Colon cancer Neg Hx    Colon polyps Neg Hx    Esophageal cancer Neg Hx    Rectal cancer Neg Hx    Stomach cancer Neg Hx    Social History   Socioeconomic History   Marital status: Divorced    Spouse name: Not on file   Number of children: 0   Years  of education: Not on file   Highest education level: Some college, no degree  Occupational History   Occupation: retired  Tobacco Use   Smoking status: Never   Smokeless tobacco: Never  Vaping Use   Vaping status: Never Used  Substance and Sexual Activity   Alcohol use: Never   Drug use: Never   Sexual activity: Not Currently  Other Topics Concern   Not on file  Social History Narrative   Not on file   Social Determinants of Health   Financial Resource Strain: Low Risk  (08/09/2023)   Overall Financial Resource Strain (CARDIA)    Difficulty of Paying Living Expenses: Not hard at all  Food Insecurity: No Food Insecurity (08/09/2023)   Hunger Vital Sign    Worried About Running Out of Food in the Last Year: Never true    Ran Out of Food in the Last Year: Never true  Transportation Needs: No Transportation Needs (08/09/2023)   PRAPARE - Administrator, Civil Service (Medical): No    Lack of Transportation (Non-Medical): No  Physical Activity: Sufficiently Active (08/09/2023)   Exercise Vital Sign    Days of Exercise per Week: 6 days    Minutes of Exercise per Session: 40 min  Stress: No Stress Concern Present (08/09/2023)   [employer] of Occupational Health - Occupational Stress Questionnaire    Feeling of Stress : Not at all  Social Connections: Moderately Integrated (08/09/2023)   Social Connection and Isolation Panel [NHANES]    Frequency of Communication with Friends and Family: More than three times a week    Frequency of Social Gatherings with Friends and Family: More than three times a week    Attends Religious Services: More than 4 times per year    Active Member of [employer] or Organizations: Yes    Attends Engineer, structural: More than 4 times per year    Marital Status: Divorced    Tobacco Counseling Counseling given: Not Answered   Clinical Intake:  Pre-visit preparation completed: Yes  Pain : No/denies pain Pain Score: 0-No  pain     BMI - recorded: 24.28 Nutritional Status: BMI of 19-24  Normal Nutritional Risks: None Diabetes: Yes CBG done?: No Did pt. bring in CBG monitor from home?: No  How often do you need to have someone help you when you read instructions, pamphlets, or other written materials from your doctor or pharmacy?: 1 - Never What is the last grade level you completed in school?: HSG; SOME COLLEGE  Interpreter Needed?: No  Information entered by :: Saloni Lablanc N. Hayden Kihara, LPN.   Activities of Daily Living    08/09/2023    8:59 AM 08/05/2023    9:02 AM  In your present state of health, do you have any difficulty performing the following activities:  Hearing? 0  0  Vision? 0 0  Difficulty concentrating or making decisions? 0 0  Walking or climbing stairs? 0 0  Dressing or bathing? 0 0  Doing errands, shopping? 0 0  Preparing Food and eating ? N N  Using the Toilet? N N  In the past six months, have you accidently leaked urine? Y Y  Do you have problems with loss of bowel control? N N  Managing your Medications? N N  Managing your Finances? N N  Housekeeping or managing your Housekeeping? N N    Patient Care Team: Plotnikov, Georgina Quint, MD as PCP - General (Internal Medicine) Christell Constant, MD as PCP - Cardiology (Cardiology) Belva Agee, MD as Consulting Physician (Urology) Armbruster, Willaim Rayas, MD as Consulting Physician (Gastroenterology) Christell Constant, MD as Consulting Physician (Cardiology) Blima Ledger, OD as Consulting Physician (Optometry)  Indicate any recent Medical Services you may have received from other than [facility] providers in the past year (date may be approximate).     Assessment:   This is a routine wellness examination for [place].  Hearing/Vision screen Hearing Screening - Comments:: Denies hearing difficulties. No hearing aids.  Vision Screening - Comments:: Wears rx glasses - up to date with routine eye exams with Blima Ledger,  OD.    Goals Addressed             This Visit's Progress    My goal is to reduce some of my medications.        Depression Screen    08/09/2023    8:55 AM 06/06/2023    7:58 AM 03/07/2023    8:00 AM 08/04/2022   10:54 AM 02/01/2022    9:27 AM 08/03/2021    1:56 PM 12/15/2020    9:14 AM  PHQ 2/9 Scores  PHQ - 2 Score 0 0 0 0 0 0 0  PHQ- 9 Score 1          Fall Risk    08/09/2023    8:55 AM 08/05/2023    9:02 AM 06/06/2023    7:58 AM 03/07/2023    8:00 AM 08/04/2022   10:51 AM  Fall Risk   Falls in the past year? 0 0 0 0 1  Number falls in past yr: 0 0 0 0 0  Injury with Fall? 0 1 0 0 1  Risk for fall due to : No Fall Risks  No Fall Risks No Fall Risks   Follow up Falls prevention discussed  Falls evaluation completed Falls evaluation completed Falls prevention discussed    MEDICARE RISK AT HOME: Medicare Risk at Home Any stairs in or around the home?: Yes If so, are there any without handrails?: No Home free of loose throw rugs in walkways, pet beds, electrical cords, etc?: Yes Adequate lighting in your home to reduce risk of falls?: Yes Life alert?: No Use of a cane, walker or w/c?: No Grab bars in the bathroom?: No Shower chair or bench in shower?: No Elevated toilet seat or a handicapped toilet?: No  TIMED UP AND GO:  Was the test performed?  No    Cognitive Function:        08/09/2023    8:56 AM 08/04/2022   11:01 AM  6CIT Screen  What Year? 0 points 0 points  What month? 0 points 0 points  What time? 0 points 0 points  Count back from 20 0 points 0 points  Months in reverse 0 points 0 points  Repeat phrase  0 points 0 points  Total Score 0 points 0 points    Immunizations Immunization History  Administered Date(s) Administered   Fluad Quad(high Dose 65+) 09/01/2021   Moderna SARS-COV2 Booster Vaccination 10/07/2020   Moderna Sars-Covid-2 Vaccination 02/06/2020, 03/07/2020   Pneumococcal Conjugate-13 12/15/2020   Tdap 03/22/2022    TDAP  status: Up to date  Flu Vaccine status: Due, Education has been provided regarding the importance of this vaccine. Advised may receive this vaccine at local pharmacy or Health Dept. Aware to provide a copy of the vaccination record if obtained from local pharmacy or Health Dept. Verbalized acceptance and understanding.  Pneumococcal vaccine status: Due, Education has been provided regarding the importance of this vaccine. Advised may receive this vaccine at local pharmacy or Health Dept. Aware to provide a copy of the vaccination record if obtained from local pharmacy or Health Dept. Verbalized acceptance and understanding.  Covid-19 vaccine status: Completed vaccines  Qualifies for Shingles Vaccine? Yes   Zostavax completed No   Shingrix Completed?: No.    Education has been provided regarding the importance of this vaccine. Patient has been advised to call insurance company to determine out of pocket expense if they have not yet received this vaccine. Advised may also receive vaccine at local pharmacy or Health Dept. Verbalized acceptance and understanding.  Screening Tests Health Maintenance  Topic Date Due   FOOT EXAM  Never done   OPHTHALMOLOGY EXAM  Never done   Diabetic kidney evaluation - Urine ACR  Never done   Hepatitis C Screening  Never done   Zoster Vaccines- Shingrix (1 of 2) Never done   COVID-19 Vaccine (3 - Moderna risk series) 11/04/2020   Pneumonia Vaccine 22+ Years old (2 of 2 - PPSV23 or PCV20) 12/15/2021   INFLUENZA VACCINE  06/23/2023   HEMOGLOBIN A1C  12/07/2023   Diabetic kidney evaluation - eGFR measurement  06/05/2024   Medicare Annual Wellness (AWV)  08/08/2024   Colonoscopy  03/12/2026   DTaP/Tdap/Td (2 - Td or Tdap) 03/22/2032   HPV VACCINES  Aged Out    Health Maintenance  Health Maintenance Due  Topic Date Due   FOOT EXAM  Never done   OPHTHALMOLOGY EXAM  Never done   Diabetic kidney evaluation - Urine ACR  Never done   Hepatitis C Screening   Never done   Zoster Vaccines- Shingrix (1 of 2) Never done   COVID-19 Vaccine (3 - Moderna risk series) 11/04/2020   Pneumonia Vaccine 78+ Years old (2 of 2 - PPSV23 or PCV20) 12/15/2021   INFLUENZA VACCINE  06/23/2023    Colorectal cancer screening: Type of screening: Colonoscopy. Completed 03/12/2021. Repeat every 5 years  Lung Cancer Screening: (Low Dose CT Chest recommended if Age 70-80 years, 20 pack-year currently smoking OR have quit w/in 15years.) does not qualify.   Lung Cancer Screening Referral: no  Additional Screening:  Hepatitis C Screening: does qualify; Completed: no  Vision Screening: Recommended annual ophthalmology exams for early detection of glaucoma and other disorders of the eye. Is the patient up to date with their annual eye exam?  Yes  Who is the provider or what is the name of the office in which the patient attends annual eye exams? Blima Ledger, OD. If pt is not established with a provider, would they like to be referred to a provider to establish care? No .   Dental Screening: Recommended annual dental exams for proper oral hygiene  Diabetic Foot Exam: Diabetic Foot Exam: Overdue, Pt has  been advised about the importance in completing this exam. Pt is scheduled for diabetic foot exam on 09/21/2023.  Community Resource Referral / Chronic Care Management: CRR required this visit?  No   CCM required this visit?  No     Plan:     I have personally reviewed and noted the following in the patient's chart:   Medical and social history Use of alcohol, tobacco or illicit drugs  Current medications and supplements including opioid prescriptions. Patient is not currently taking opioid prescriptions. Functional ability and status Nutritional status Physical activity Advanced directives List of other physicians Hospitalizations, surgeries, and ER visits in previous 12 months Vitals Screenings to include cognitive, depression, and falls Referrals and  appointments  In addition, I have reviewed and discussed with patient certain preventive protocols, quality metrics, and best practice recommendations. A written personalized care plan for preventive services as well as general preventive health recommendations were provided to patient.     Mickeal Needy, LPN   2/35/5732   After Visit Summary: (Mail) Due to this being a telephonic visit, the after visit summary with patients personalized plan was offered to patient via mail   Nurse Notes:  Normal cognitive status assessed by direct observation via telephone conversation by this Nurse Health Advisor. No abnormalities found.  Patient provided his/her weight during this virtual phone visit.   Medical screening examination/treatment/procedure(s) were performed by non-physician practitioner and as supervising physician I was immediately available for consultation/collaboration.  I agree with above. Jacinta Shoe, MD

## 2023-08-09 NOTE — Patient Instructions (Addendum)
Shawn Orozco , Thank you for taking time to come for your Medicare Wellness Visit. I appreciate your ongoing commitment to your health goals. Please review the following plan we discussed and let me know if I can assist you in the future.   Referrals/Orders/Follow-Ups/Clinician Recommendations: NO  This is a list of the screening recommended for you and due dates:  Health Maintenance  Topic Date Due   Complete foot exam   Never done   Eye exam for diabetics  Never done   Yearly kidney health urinalysis for diabetes  Never done   Hepatitis C Screening  Never done   Zoster (Shingles) Vaccine (1 of 2) Never done   COVID-19 Vaccine (3 - Moderna risk series) 11/04/2020   Pneumonia Vaccine (2 of 2 - PPSV23 or PCV20) 12/15/2021   Flu Shot  06/23/2023   Hemoglobin A1C  12/07/2023   Yearly kidney function blood test for diabetes  06/05/2024   Medicare Annual Wellness Visit  08/08/2024   Colon Cancer Screening  03/12/2026   DTaP/Tdap/Td vaccine (2 - Td or Tdap) 03/22/2032   HPV Vaccine  Aged Out    Advanced directives: (Copy Requested) Please bring a copy of your health care power of attorney and living will to the office to be added to your chart at your convenience.  Next Medicare Annual Wellness Visit scheduled for next year: Yes

## 2023-08-23 ENCOUNTER — Encounter: Payer: Self-pay | Admitting: Family Medicine

## 2023-08-23 ENCOUNTER — Ambulatory Visit (INDEPENDENT_AMBULATORY_CARE_PROVIDER_SITE_OTHER): Payer: Medicare Other

## 2023-08-23 ENCOUNTER — Ambulatory Visit: Payer: Medicare Other | Admitting: Family Medicine

## 2023-08-23 ENCOUNTER — Other Ambulatory Visit: Payer: Self-pay

## 2023-08-23 VITALS — BP 122/72 | HR 63 | Ht 72.0 in | Wt 176.0 lb

## 2023-08-23 DIAGNOSIS — M25561 Pain in right knee: Secondary | ICD-10-CM

## 2023-08-23 DIAGNOSIS — M23311 Other meniscus derangements, anterior horn of medial meniscus, right knee: Secondary | ICD-10-CM | POA: Diagnosis not present

## 2023-08-23 NOTE — Assessment & Plan Note (Signed)
Patient given injection today and tolerated the procedure well.  Discussed icing regimen and home exercises, which activities to do and which ones to avoid.  Increase activity slowly otherwise.  Discussed icing regimen.  Follow-up again in 6 to 8 weeks

## 2023-08-23 NOTE — Patient Instructions (Signed)
Injection in knee today You have 14 days to return or exchange your brace Call [phone], then return the brace to our office Do prescribed exercises at least 3x a week See you again in 4-5 weeks

## 2023-08-23 NOTE — Progress Notes (Addendum)
[name] 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  I do think that there is some mild instability that could be contributing the patient's pain is completely resolved at the moment with home exercises.  We will continue to monitor and if any worsening symptoms I believe patient will do well with conservative therapy and follow-up with me again in 3 months     Updated 08/23/2023 Shawn Orozco is a 77 y.o. male coming in with complaint of pain below patella after he stands and starts walking. Pain continues when walking. After rest it doesn't hurt as much. Started about 4 weeks ago and has progressively gotten worse. Sharp pain that sits. No radiating down leg. Home therapies not really helping.       Past Medical History:  Diagnosis Date   Arthritis    Cataract    removed bilat 2014 with lens implants    Colon polyps    Deviated septum    GERD (gastroesophageal reflux disease)    Hyperlipidemia    preventative for FH    Prostate cancer (HCC) 2021   04-14-2020 prostate surgery    Past Surgical History:  Procedure Laterality Date   CATARACT EXTRACTION, BILATERAL     COLONOSCOPY     KNEE ARTHROSCOPY Right    NASAL SEPTUM SURGERY     POLYPECTOMY     PROSTATECTOMY  04/14/2020   SHOULDER ARTHROSCOPY Right    TONSILLECTOMY     Social History   Socioeconomic History   Marital status: Divorced    Spouse name: Not on file   Number of children: 0   Years of education: Not on file   Highest education level: Some college, no degree  Occupational History   Occupation: retired  Tobacco Use   Smoking status: Never   Smokeless tobacco: Never  Vaping Use   Vaping status: Never Used  Substance and Sexual Activity   Alcohol  use: Never   Drug use: Never   Sexual activity: Not Currently  Other Topics Concern   Not on file  Social History Narrative   Not on file   Social Determinants of Health   Financial Resource Strain: Low Risk  (08/09/2023)   Overall Financial Resource Strain (CARDIA)    Difficulty of Paying Living Expenses: Not hard at all  Food Insecurity: No Food Insecurity (08/09/2023)   Hunger Vital Sign    Worried About Running Out of Food in the Last Year: Never true    Ran Out of Food in the Last Year: Never true  Transportation Needs: No Transportation Needs (08/09/2023)   PRAPARE - Administrator, Civil Service (Medical): No    Lack of Transportation (Non-Medical): No  Physical Activity: Sufficiently Active (08/09/2023)   Exercise Vital Sign    Days of Exercise per Week: 6 days    Minutes of Exercise per Session: 40 min  Stress: No Stress Concern Present (08/09/2023)   [employer] of Occupational Health - Occupational Stress Questionnaire    Feeling of Stress : Not at all  Social Connections: Moderately Integrated (08/09/2023)   Social Connection and Isolation Panel [NHANES]    Frequency of Communication with Friends and Family: More than three times a week  Frequency of Social Gatherings with Friends and Family: More than three times a week    Attends Religious Services: More than 4 times per year    Active Member of Clubs or Organizations: Yes    Attends Engineer, structural: More than 4 times per year    Marital Status: Divorced   Allergies  Allergen Reactions   Prednisone Other (See Comments)    Pt does not want to take to due to the way it makes him feel.   Family History  Problem Relation Age of Onset   Alzheimer's disease Mother    Alzheimer's disease Brother    Heart disease Brother    Heart disease Brother    Colon cancer Neg Hx    Colon polyps Neg Hx    Esophageal cancer Neg Hx    Rectal cancer Neg Hx    Stomach cancer Neg Hx     Current  Outpatient Medications (Endocrine & Metabolic):    dapagliflozin propanediol (FARXIGA) 5 MG TABS tablet, Take 1 tablet (5 mg total) by mouth daily before breakfast. (Patient not taking: Reported on 08/09/2023)  Current Outpatient Medications (Cardiovascular):    rosuvastatin (CRESTOR) 20 MG tablet, Take 1 tablet (20 mg total) by mouth daily.  Current Outpatient Medications (Respiratory):    Hypertonic Nasal Wash (SINUS RINSE) PACK, Place into the nose as needed.   loratadine (CLARITIN) 10 MG tablet, Take 1 tablet (10 mg total) by mouth daily.    Current Outpatient Medications (Other):    cholecalciferol (VITAMIN D3) 25 MCG (1000 UNIT) tablet, Take 1,000 Units by mouth daily.   ketoconazole (NIZORAL) 2 % cream, APPLY A SMALL AMOUNT TOPICALLY BETWEEN THE TOES AND TO AFFECTED AREA(S) TWO TIMES A DAY AS NEEDED   Niacinamide 500 MG TBCR, Take 500 mg by mouth daily.   omeprazole (PRILOSEC) 20 MG capsule, Take 1 capsule (20 mg total) by mouth daily.   Reviewed prior external information including notes and imaging from  primary care provider As well as notes that were available from care everywhere and other healthcare systems.  Past medical history, social, surgical and family history all reviewed in electronic medical record.  No pertanent information unless stated regarding to the chief complaint.   Review of Systems:  No headache, visual changes, nausea, vomiting, diarrhea, constipation, dizziness, abdominal pain, skin rash, fevers, chills, night sweats, weight loss, swollen lymph nodes, body aches, joint swelling, chest pain, shortness of breath, mood changes. POSITIVE muscle aches  Objective  Blood pressure 122/72, pulse 63, height 6' (1.829 m), weight 176 lb (79.8 kg), SpO2 98%.   General: No apparent distress alert and oriented x3 mood and affect normal, dressed appropriately.  HEENT: Pupils equal, extraocular movements intact  Respiratory: Patient's speak in full sentences and does  not appear short of breath  Cardiovascular: No lower extremity edema, non tender, no erythema  Knee exam does have some bony abnormalities noted.  Some tenderness to palpation in the medial aspect of the knee.  Patient does have a positive McMurray's noted.  Good range of motion noted.   Limited muscular skeletal ultrasound was performed and interpreted by Antoine Primas, M   Limited ultrasound shows the patient does have hypoechoic changes noted.  Patient does have degenerative changes of the medial meniscus noted.  Mild displacement noted.  Mild arthritic changes noted.  No bony abnormality noted. Impression: Medial meniscal tear  After informed written and verbal consent, patient was seated on exam table. Right knee was prepped with alcohol  swab and utilizing anterolateral approach, patient's right knee space was injected with 4:1  marcaine 0.5%: Kenalog 40mg /dL. Patient tolerated the procedure well without immediate complications.  Impression and Recommendations:    The above documentation has been reviewed and is accurate and complete Judi Saa, DO

## 2023-08-31 ENCOUNTER — Other Ambulatory Visit (INDEPENDENT_AMBULATORY_CARE_PROVIDER_SITE_OTHER): Payer: Medicare Other

## 2023-08-31 DIAGNOSIS — R739 Hyperglycemia, unspecified: Secondary | ICD-10-CM

## 2023-08-31 LAB — COMPREHENSIVE METABOLIC PANEL
ALT: 15 U/L (ref 0–53)
AST: 17 U/L (ref 0–37)
Albumin: 4.4 g/dL (ref 3.5–5.2)
Alkaline Phosphatase: 76 U/L (ref 39–117)
BUN: 22 mg/dL (ref 6–23)
CO2: 28 meq/L (ref 19–32)
Calcium: 10.2 mg/dL (ref 8.4–10.5)
Chloride: 103 meq/L (ref 96–112)
Creatinine, Ser: 1.29 mg/dL (ref 0.40–1.50)
GFR: 53.36 mL/min — ABNORMAL LOW (ref >60.00)
Glucose, Bld: 118 mg/dL — ABNORMAL HIGH (ref 70–99)
Potassium: 4.2 meq/L (ref 3.5–5.1)
Sodium: 140 meq/L (ref 135–145)
Total Bilirubin: 1.8 mg/dL — ABNORMAL HIGH (ref 0.2–1.2)
Total Protein: 7 g/dL (ref 6.0–8.3)

## 2023-08-31 LAB — HEMOGLOBIN A1C: Hgb A1c MFr Bld: 6.5 % (ref 4.6–6.5)

## 2023-09-14 ENCOUNTER — Other Ambulatory Visit: Payer: Medicare Other | Admitting: Internal Medicine

## 2023-09-21 ENCOUNTER — Ambulatory Visit: Payer: Medicare Other | Admitting: Internal Medicine

## 2023-09-21 ENCOUNTER — Encounter: Payer: Self-pay | Admitting: Internal Medicine

## 2023-09-21 VITALS — BP 122/70 | HR 65 | Temp 97.6°F | Ht 72.0 in | Wt 177.0 lb

## 2023-09-21 DIAGNOSIS — C61 Malignant neoplasm of prostate: Secondary | ICD-10-CM

## 2023-09-21 DIAGNOSIS — E118 Type 2 diabetes mellitus with unspecified complications: Secondary | ICD-10-CM

## 2023-09-21 DIAGNOSIS — J339 Nasal polyp, unspecified: Secondary | ICD-10-CM

## 2023-09-21 DIAGNOSIS — Z23 Encounter for immunization: Secondary | ICD-10-CM

## 2023-09-21 DIAGNOSIS — E785 Hyperlipidemia, unspecified: Secondary | ICD-10-CM | POA: Diagnosis not present

## 2023-09-21 DIAGNOSIS — N1831 Chronic kidney disease, stage 3a: Secondary | ICD-10-CM

## 2023-09-21 DIAGNOSIS — I2583 Coronary atherosclerosis due to lipid rich plaque: Secondary | ICD-10-CM

## 2023-09-21 NOTE — Assessment & Plan Note (Signed)
On ASA, Crestor 

## 2023-09-21 NOTE — Assessment & Plan Note (Signed)
ENT f/u Doing well on Loratidine

## 2023-09-21 NOTE — Progress Notes (Signed)
Subjective:  Patient ID: Shawn Orozco, male    DOB: 07-10-1946  Age: 77 y.o. MRN: [id number]  CC: Medical Management of Chronic Issues (3 month f/u)   HPI Shawn Orozco presents for DM, HTN, dyslipidemia Marcelline Deist was >$600, he did not start it yet Pt lost wt - 20 lbs on diet  Outpatient Medications Prior to Visit  Medication Sig Dispense Refill   cholecalciferol (VITAMIN D3) 25 MCG (1000 UNIT) tablet Take 1,000 Units by mouth daily.     Hypertonic Nasal Wash (SINUS RINSE) PACK Place into the nose as needed.     ketoconazole (NIZORAL) 2 % cream APPLY A SMALL AMOUNT TOPICALLY BETWEEN THE TOES AND TO AFFECTED AREA(S) TWO TIMES A DAY AS NEEDED     loratadine (CLARITIN) 10 MG tablet Take 1 tablet (10 mg total) by mouth daily. 30 tablet 11   Niacinamide 500 MG TBCR Take 500 mg by mouth daily.     omeprazole (PRILOSEC) 20 MG capsule Take 1 capsule (20 mg total) by mouth daily. 90 capsule 0   rosuvastatin (CRESTOR) 20 MG tablet Take 1 tablet (20 mg total) by mouth daily. 90 tablet 3   dapagliflozin propanediol (FARXIGA) 5 MG TABS tablet Take 1 tablet (5 mg total) by mouth daily before breakfast. (Patient not taking: Reported on 09/21/2023) 30 tablet 5   No facility-administered medications prior to visit.    ROS: Review of Systems  Constitutional:  Negative for appetite change, fatigue and unexpected weight change.  HENT:  Negative for congestion, nosebleeds, sneezing, sore throat and trouble swallowing.   Eyes:  Negative for itching and visual disturbance.  Respiratory:  Negative for cough.   Cardiovascular:  Negative for chest pain, palpitations and leg swelling.  Gastrointestinal:  Negative for abdominal distention, blood in stool, diarrhea and nausea.  Genitourinary:  Negative for frequency and hematuria.  Musculoskeletal:  Negative for back pain, gait problem, joint swelling and neck pain.  Skin:  Negative for rash.  Neurological:  Negative for dizziness, tremors, speech difficulty  and weakness.  Psychiatric/Behavioral:  Negative for agitation, dysphoric mood and sleep disturbance. The patient is not nervous/anxious.     Objective:  BP 122/70 (BP Location: Left Arm, Patient Position: Sitting, Cuff Size: Normal)   Pulse 65   Temp 97.6 F (36.4 C) (Temporal)   Ht 6' (1.829 m)   Wt 177 lb (80.3 kg)   SpO2 99%   BMI 24.01 kg/m   BP Readings from Last 3 Encounters:  09/21/23 122/70  08/23/23 122/72  06/06/23 118/70    Wt Readings from Last 3 Encounters:  09/21/23 177 lb (80.3 kg)  08/23/23 176 lb (79.8 kg)  08/09/23 179 lb (81.2 kg)    Physical Exam Constitutional:      General: He is not in acute distress.    Appearance: He is well-developed.     Comments: NAD  Eyes:     Conjunctiva/sclera: Conjunctivae normal.     Pupils: Pupils are equal, round, and reactive to light.  Neck:     Thyroid: No thyromegaly.     Vascular: No JVD.  Cardiovascular:     Rate and Rhythm: Normal rate and regular rhythm.     Heart sounds: Normal heart sounds. No murmur heard.    No friction rub. No gallop.  Pulmonary:     Effort: Pulmonary effort is normal. No respiratory distress.     Breath sounds: Normal breath sounds. No wheezing or rales.  Chest:     Chest wall: No  tenderness.  Abdominal:     General: Bowel sounds are normal. There is no distension.     Palpations: Abdomen is soft. There is no mass.     Tenderness: There is no abdominal tenderness. There is no guarding or rebound.  Musculoskeletal:        General: No tenderness. Normal range of motion.     Cervical back: Normal range of motion.  Lymphadenopathy:     Cervical: No cervical adenopathy.  Skin:    General: Skin is warm and dry.     Findings: No rash.  Neurological:     Mental Status: He is alert and oriented to person, place, and time.     Cranial Nerves: No cranial nerve deficit.     Motor: No abnormal muscle tone.     Coordination: Coordination normal.     Gait: Gait normal.     Deep Tendon  Reflexes: Reflexes are normal and symmetric.  Psychiatric:        Behavior: Behavior normal.        Thought Content: Thought content normal.        Judgment: Judgment normal.     Lab Results  Component Value Date   WBC 7.5 03/03/2023   HGB 15.8 03/03/2023   HCT 45.3 03/03/2023   PLT 217.0 03/03/2023   GLUCOSE 118 (H) 08/31/2023   CHOL 132 04/07/2023   TRIG 108 04/07/2023   HDL 41 04/07/2023   LDLCALC 71 04/07/2023   ALT 15 08/31/2023   AST 17 08/31/2023   NA 140 08/31/2023   K 4.2 08/31/2023   CL 103 08/31/2023   CREATININE 1.29 08/31/2023   BUN 22 08/31/2023   CO2 28 08/31/2023   TSH 3.44 03/03/2023   PSA 0.07 (L) 08/26/2022   HGBA1C 6.5 08/31/2023    NM PET (PSMA) SKULL TO MID THIGH  Result Date: 03/23/2023 CLINICAL DATA:  Prostate carcinoma with biochemical recurrence. EXAM: NUCLEAR MEDICINE PET SKULL BASE TO THIGH TECHNIQUE: 9.5 mCi F18 Piflufolastat (Pylarify) was injected intravenously. Full-ring PET imaging was performed from the skull base to thigh after the radiotracer. CT data was obtained and used for attenuation correction and anatomic localization. COMPARISON:  None Available. FINDINGS: NECK No radiotracer activity in neck lymph nodes. Incidental CT finding: None. CHEST No radiotracer accumulation within mediastinal or hilar lymph nodes. No suspicious pulmonary nodules on the CT scan. Incidental CT finding: None. ABDOMEN/PELVIS Prostate: No focal activity in the prostate bed. Lymph nodes: No abnormal radiotracer accumulation within pelvic or abdominal nodes. Liver: No evidence of liver metastasis. 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 SKELETON No focal activity to suggest skeletal metastasis. No sclerotic or lytic lesions on CT imaging. IMPRESSION: 1. No evidence of local prostate cancer recurrence in the prostatectomy bed. 2. No evidence of metastatic adenopathy in the pelvis or periaortic  retroperitoneum. 3. No evidence of visceral metastasis or skeletal metastasis. Electronically Signed   By: Genevive Bi M.D.   On: 03/23/2023 16:08    Assessment & Plan:   Problem List Items Addressed This Visit     Prostate cancer [facility])    PET CT (-) 03/2023 PSA tomorrow      Dyslipidemia    On ASA, Crestor      Coronary atherosclerosis    Calcium seen in the proximal and mid LAD proximal L circumflex. On ASA, Crestor      Nasal polyp    ENT f/u Doing well on Loratidine  Chronic renal impairment, stage 3a (HCC)    GFR 47.7 Hydrate better Start Farxiga 5 mg daily if tolerated       Diabetes type 2, controlled (HCC)    Farxiga was >$600, he did not start it yet Pt lost wt - 20 lbs on diet Monitor labs      Relevant Orders   Hemoglobin A1c   Comprehensive metabolic panel   Other Visit Diagnoses     Need for influenza vaccination    -  Primary   Relevant Orders   Flu Vaccine Trivalent High Dose (Fluad) (Completed)         No orders of the defined types were placed in this encounter.     Follow-up: Return in about 6 months (around 03/21/2024) for a follow-up visit.  Sonda Primes, MD

## 2023-09-21 NOTE — Assessment & Plan Note (Addendum)
PET CT (-) 03/2023 PSA tomorrow

## 2023-09-21 NOTE — Assessment & Plan Note (Signed)
Calcium seen in the proximal and mid LAD proximal L circumflex. On ASA, Crestor 

## 2023-09-21 NOTE — Assessment & Plan Note (Signed)
Shawn Orozco was >$600, he did not start it yet Pt lost wt - 20 lbs on diet Monitor labs

## 2023-09-21 NOTE — Assessment & Plan Note (Signed)
GFR 47.7 Hydrate better Start Farxiga 5 mg daily if tolerated

## 2023-09-22 ENCOUNTER — Encounter: Payer: Self-pay | Admitting: Family Medicine

## 2023-09-22 ENCOUNTER — Ambulatory Visit (INDEPENDENT_AMBULATORY_CARE_PROVIDER_SITE_OTHER): Payer: Medicare Other | Admitting: Family Medicine

## 2023-09-22 VITALS — BP 118/78 | HR 65 | Ht 72.0 in | Wt 176.0 lb

## 2023-09-22 DIAGNOSIS — M23311 Other meniscus derangements, anterior horn of medial meniscus, right knee: Secondary | ICD-10-CM

## 2023-09-22 NOTE — Assessment & Plan Note (Signed)
Significant improvement at this time.  No significant locking anymore at this time.  The injections seem to help.  Discussed wearing the brace only with increasing activity on instability.  Follow-up with me again as needed

## 2023-09-22 NOTE — Progress Notes (Signed)
[name] [address] [place] [address] Phone: [phone] Subjective:   Shawn Orozco, am serving as a scribe for Dr. Antoine Orozco.\  I'm seeing this patient by the request  of:  Plotnikov, Shawn Quint, MD  CC: Right knee pain  [id number]  08/23/2023      Patient given injection today and tolerated the procedure well.  Discussed icing regimen and home exercises, which activities to do and which ones to avoid.  Increase activity slowly otherwise.  Discussed icing regimen.  Follow-up again in 6 to 8 weeks     Uipdate 09/22/2023 Shawn Orozco is a 77 y.o. male coming in with complaint of R knee pain. Patient states that he is doing much better. Has had a few times when his pain has increased. Brace is helpful.       Past Medical History:  Diagnosis Date   Arthritis    Cataract    removed bilat 2014 with lens implants    Colon polyps    Deviated septum    GERD (gastroesophageal reflux disease)    Hyperlipidemia    preventative for FH    Prostate cancer (HCC) 2021   04-14-2020 prostate surgery    Past Surgical History:  Procedure Laterality Date   CATARACT EXTRACTION, BILATERAL     COLONOSCOPY     KNEE ARTHROSCOPY Right    NASAL SEPTUM SURGERY     POLYPECTOMY     PROSTATECTOMY  04/14/2020   SHOULDER ARTHROSCOPY Right    TONSILLECTOMY     Social History   Socioeconomic History   Marital status: Divorced    Spouse name: Not on file   Number of children: 0   Years of education: Not on file   Highest education level: Some college, no degree  Occupational History   Occupation: retired  Tobacco Use   Smoking status: Never   Smokeless tobacco: Never  Vaping Use   Vaping status: Never Used  Substance and Sexual Activity   Alcohol use: Never   Drug use: Never   Sexual activity: Not Currently  Other Topics Concern   Not on file  Social History Narrative   Not on file   Social Determinants of Health   Financial  Resource Strain: Low Risk  (09/17/2023)   Overall Financial Resource Strain (CARDIA)    Difficulty of Paying Living Expenses: Not hard at all  Food Insecurity: No Food Insecurity (09/17/2023)   Hunger Vital Sign    Worried About Running Out of Food in the Last Year: Never true    Ran Out of Food in the Last Year: Never true  Transportation Needs: No Transportation Needs (09/17/2023)   PRAPARE - Administrator, Civil Service (Medical): No    Lack of Transportation (Non-Medical): No  Physical Activity: Sufficiently Active (09/17/2023)   Exercise Vital Sign    Days of Exercise per Week: 5 days    Minutes of Exercise per Session: 60 min  Stress: No Stress Concern Present (09/17/2023)   [employer] of Occupational Health - Occupational Stress Questionnaire    Feeling of Stress : Not at all  Social Connections: Moderately Integrated (09/17/2023)   Social Connection and Isolation Panel [NHANES]    Frequency of Communication with Friends and Family: More than three times a week    Frequency of Social Gatherings with Friends and Family: More than three times a week    Attends Religious Services: More than 4 times per year  Active Member of Clubs or Organizations: Yes    Attends Banker Meetings: More than 4 times per year    Marital Status: Divorced   Allergies  Allergen Reactions   Prednisone Other (See Comments)    Pt does not want to take to due to the way it makes him feel.   Family History  Problem Relation Age of Onset   Alzheimer's disease Mother    Alzheimer's disease Brother    Heart disease Brother    Heart disease Brother    Colon cancer Neg Hx    Colon polyps Neg Hx    Esophageal cancer Neg Hx    Rectal cancer Neg Hx    Stomach cancer Neg Hx     Current Outpatient Medications (Endocrine & Metabolic):    dapagliflozin propanediol (FARXIGA) 5 MG TABS tablet, Take 1 tablet (5 mg total) by mouth daily before breakfast.  Current Outpatient  Medications (Cardiovascular):    rosuvastatin (CRESTOR) 20 MG tablet, Take 1 tablet (20 mg total) by mouth daily.  Current Outpatient Medications (Respiratory):    Hypertonic Nasal Wash (SINUS RINSE) PACK, Place into the nose as needed.   loratadine (CLARITIN) 10 MG tablet, Take 1 tablet (10 mg total) by mouth daily.    Current Outpatient Medications (Other):    cholecalciferol (VITAMIN D3) 25 MCG (1000 UNIT) tablet, Take 1,000 Units by mouth daily.   ketoconazole (NIZORAL) 2 % cream, APPLY A SMALL AMOUNT TOPICALLY BETWEEN THE TOES AND TO AFFECTED AREA(S) TWO TIMES A DAY AS NEEDED   Niacinamide 500 MG TBCR, Take 500 mg by mouth daily.   omeprazole (PRILOSEC) 20 MG capsule, Take 1 capsule (20 mg total) by mouth daily.   Objective  Blood pressure 118/78, pulse 65, height 6' (1.829 m), weight 176 lb (79.8 kg), SpO2 98%.   General: No apparent distress alert and oriented x3 mood and affect normal, dressed appropriately.  HEENT: Pupils equal, extraocular movements intact  Respiratory: Patient's speak in full sentences and does not appear short of breath  Cardiovascular: No lower extremity edema, non tender, no erythema  Right knee has significant improvement in range of motion.  Patient still has some crepitus and some mild arthritic changes.  Nontender on exam no.  Negative McMurray's    Impression and Recommendations:    The above documentation has been reviewed and is accurate and complete Judi Saa, DO

## 2023-10-28 ENCOUNTER — Other Ambulatory Visit: Payer: Self-pay | Admitting: *Deleted

## 2023-10-28 MED ORDER — OMEPRAZOLE 20 MG PO CPDR: 20.0000 mg | DELAYED_RELEASE_CAPSULE | Freq: Every day | ORAL | 0 refills | Status: DC

## 2023-12-08 ENCOUNTER — Ambulatory Visit: Payer: Medicare Other | Admitting: Internal Medicine

## 2024-01-08 ENCOUNTER — Other Ambulatory Visit: Payer: Self-pay | Admitting: Internal Medicine

## 2024-02-17 ENCOUNTER — Other Ambulatory Visit: Payer: Self-pay | Admitting: Internal Medicine

## 2024-02-21 ENCOUNTER — Telehealth: Payer: Self-pay | Admitting: Gastroenterology

## 2024-02-21 MED ORDER — OMEPRAZOLE 20 MG PO CPDR: 20.0000 mg | DELAYED_RELEASE_CAPSULE | Freq: Every day | ORAL | 1 refills | Status: DC

## 2024-02-21 NOTE — Telephone Encounter (Signed)
 Inbound call from patient stating that he needs refill for Prilosec. Please advise.

## 2024-02-21 NOTE — Telephone Encounter (Signed)
 Script sent to pharmacy.

## 2024-03-14 ENCOUNTER — Other Ambulatory Visit (INDEPENDENT_AMBULATORY_CARE_PROVIDER_SITE_OTHER)

## 2024-03-14 DIAGNOSIS — E118 Type 2 diabetes mellitus with unspecified complications: Secondary | ICD-10-CM | POA: Diagnosis not present

## 2024-03-14 LAB — COMPREHENSIVE METABOLIC PANEL WITH GFR
ALT: 13 U/L (ref 0–53)
AST: 20 U/L (ref 0–37)
Albumin: 4.2 g/dL (ref 3.5–5.2)
Alkaline Phosphatase: 54 U/L (ref 39–117)
BUN: 26 mg/dL — ABNORMAL HIGH (ref 6–23)
CO2: 29 meq/L (ref 19–32)
Calcium: 9.3 mg/dL (ref 8.4–10.5)
Chloride: 104 meq/L (ref 96–112)
Creatinine, Ser: 1.33 mg/dL (ref 0.40–1.50)
GFR: 51.25 mL/min — ABNORMAL LOW (ref >60.00)
Glucose, Bld: 111 mg/dL — ABNORMAL HIGH (ref 70–99)
Potassium: 4.1 meq/L (ref 3.5–5.1)
Sodium: 139 meq/L (ref 135–145)
Total Bilirubin: 2.6 mg/dL — ABNORMAL HIGH (ref 0.2–1.2)
Total Protein: 6.9 g/dL (ref 6.0–8.3)

## 2024-03-14 LAB — HEMOGLOBIN A1C: Hgb A1c MFr Bld: 6.6 % — ABNORMAL HIGH (ref 4.6–6.5)

## 2024-03-21 ENCOUNTER — Ambulatory Visit (INDEPENDENT_AMBULATORY_CARE_PROVIDER_SITE_OTHER): Payer: Medicare Other | Admitting: Internal Medicine

## 2024-03-21 ENCOUNTER — Ambulatory Visit (INDEPENDENT_AMBULATORY_CARE_PROVIDER_SITE_OTHER)

## 2024-03-21 ENCOUNTER — Encounter: Payer: Self-pay | Admitting: Internal Medicine

## 2024-03-21 VITALS — BP 110/68 | HR 82 | Temp 97.6°F | Ht 72.0 in | Wt 180.8 lb

## 2024-03-21 DIAGNOSIS — E785 Hyperlipidemia, unspecified: Secondary | ICD-10-CM | POA: Diagnosis not present

## 2024-03-21 DIAGNOSIS — E118 Type 2 diabetes mellitus with unspecified complications: Secondary | ICD-10-CM | POA: Diagnosis not present

## 2024-03-21 DIAGNOSIS — J069 Acute upper respiratory infection, unspecified: Secondary | ICD-10-CM | POA: Insufficient documentation

## 2024-03-21 DIAGNOSIS — N1831 Chronic kidney disease, stage 3a: Secondary | ICD-10-CM

## 2024-03-21 DIAGNOSIS — C61 Malignant neoplasm of prostate: Secondary | ICD-10-CM | POA: Diagnosis not present

## 2024-03-21 LAB — COMPREHENSIVE METABOLIC PANEL WITH GFR
ALT: 13 U/L (ref 0–53)
AST: 16 U/L (ref 0–37)
Albumin: 4.3 g/dL (ref 3.5–5.2)
Alkaline Phosphatase: 66 U/L (ref 39–117)
BUN: 20 mg/dL (ref 6–23)
CO2: 27 meq/L (ref 19–32)
Calcium: 9.6 mg/dL (ref 8.4–10.5)
Chloride: 103 meq/L (ref 96–112)
Creatinine, Ser: 1.46 mg/dL (ref 0.40–1.50)
GFR: 45.82 mL/min — ABNORMAL LOW (ref >60.00)
Glucose, Bld: 187 mg/dL — ABNORMAL HIGH (ref 70–99)
Potassium: 3.6 meq/L (ref 3.5–5.1)
Sodium: 137 meq/L (ref 135–145)
Total Bilirubin: 1.9 mg/dL — ABNORMAL HIGH (ref 0.2–1.2)
Total Protein: 7.1 g/dL (ref 6.0–8.3)

## 2024-03-21 LAB — HEMOGLOBIN A1C: Hgb A1c MFr Bld: 6.6 % — ABNORMAL HIGH (ref 4.6–6.5)

## 2024-03-21 MED ORDER — ALBUTEROL SULFATE HFA 108 (90 BASE) MCG/ACT IN AERS: 2.0000 | INHALATION_SPRAY | RESPIRATORY_TRACT | 3 refills | Status: DC | PRN

## 2024-03-21 MED ORDER — CEFDINIR 300 MG PO CAPS
300.0000 mg | ORAL_CAPSULE | Freq: Two times a day (BID) | ORAL | 0 refills | Status: DC
Start: 2024-03-21 — End: 2024-04-09

## 2024-03-21 MED ORDER — HYDROCODONE BIT-HOMATROP MBR 5-1.5 MG/5ML PO SOLN: 5.0000 mL | Freq: Three times a day (TID) | ORAL | 0 refills | Status: DC | PRN

## 2024-03-21 NOTE — Assessment & Plan Note (Signed)
On ASA, Crestor 

## 2024-03-21 NOTE — Progress Notes (Signed)
 Subjective:  Patient ID: Shawn Orozco, male    DOB: October 14, 1946  Age: 78 y.o. MRN: [id number]  CC: Medical Management of Chronic Issues (6 Month Follow Up. Patient notes of wheezing, productive cough (mostly clear, sometimes tinges of color) since last Thursday. Has gotten better over time. Currently treating with robitussin flu/cold/congestion. Would also like to review last set of labs)   HPI Shawn Orozco presents for bad URI sx's x 5-6 d, DM, GERD  Outpatient Medications Prior to Visit  Medication Sig Dispense Refill   cholecalciferol (VITAMIN D3) 25 MCG (1000 UNIT) tablet Take 1,000 Units by mouth daily.     Hypertonic Nasal Wash (SINUS RINSE) PACK Place into the nose as needed.     ketoconazole (NIZORAL) 2 % cream APPLY A SMALL AMOUNT TOPICALLY BETWEEN THE TOES AND TO AFFECTED AREA(S) TWO TIMES A DAY AS NEEDED     loratadine  (CLARITIN ) 10 MG tablet Take 1 tablet (10 mg total) by mouth daily. 30 tablet 11   Niacinamide 500 MG TBCR Take 500 mg by mouth daily.     omeprazole  (PRILOSEC) 20 MG capsule Take 1 capsule (20 mg total) by mouth daily. 90 capsule 1   rosuvastatin  (CRESTOR ) 20 MG tablet TAKE ONE TABLET BY MOUTH ONE TIME DAILY 30 tablet 0   dapagliflozin  propanediol (FARXIGA ) 5 MG TABS tablet Take 1 tablet (5 mg total) by mouth daily before breakfast. 30 tablet 5   No facility-administered medications prior to visit.    ROS: Review of Systems  Constitutional:  Positive for fatigue. Negative for appetite change and unexpected weight change.  HENT:  Positive for congestion. Negative for nosebleeds, sneezing, sore throat and trouble swallowing.   Eyes:  Negative for itching and visual disturbance.  Respiratory:  Positive for cough, shortness of breath and wheezing.   Cardiovascular:  Negative for chest pain, palpitations and leg swelling.  Gastrointestinal:  Negative for abdominal distention, blood in stool, diarrhea and nausea.  Genitourinary:  Negative for frequency and  hematuria.  Musculoskeletal:  Negative for back pain, gait problem, joint swelling and neck pain.  Skin:  Negative for rash.  Neurological:  Negative for dizziness, tremors, speech difficulty and weakness.  Psychiatric/Behavioral:  Negative for agitation, dysphoric mood and sleep disturbance. The patient is not nervous/anxious.     Objective:  BP 110/68   Pulse 82   Temp 97.6 F (36.4 C)   Ht 6' (1.829 m)   Wt 180 lb 12.8 oz (82 kg)   SpO2 99%   BMI 24.52 kg/m   BP Readings from Last 3 Encounters:  03/21/24 110/68  09/22/23 118/78  09/21/23 122/70    Wt Readings from Last 3 Encounters:  03/21/24 180 lb 12.8 oz (82 kg)  09/22/23 176 lb (79.8 kg)  09/21/23 177 lb (80.3 kg)    Physical Exam Constitutional:      General: He is not in acute distress.    Appearance: Normal appearance. He is well-developed.     Comments: NAD  Eyes:     Conjunctiva/sclera: Conjunctivae normal.     Pupils: Pupils are equal, round, and reactive to light.  Neck:     Thyroid : No thyromegaly.     Vascular: No JVD.  Cardiovascular:     Rate and Rhythm: Normal rate and regular rhythm.     Heart sounds: Normal heart sounds. No murmur heard.    No friction rub. No gallop.  Pulmonary:     Effort: Pulmonary effort is normal. No respiratory distress.  Breath sounds: Rhonchi present. No wheezing or rales.  Chest:     Chest wall: No tenderness.  Abdominal:     General: Bowel sounds are normal. There is no distension.     Palpations: Abdomen is soft. There is no mass.     Tenderness: There is no abdominal tenderness. There is no guarding or rebound.  Musculoskeletal:        General: No tenderness. Normal range of motion.     Cervical back: Normal range of motion.  Lymphadenopathy:     Cervical: No cervical adenopathy.  Skin:    General: Skin is warm and dry.     Findings: No rash.  Neurological:     Mental Status: He is alert and oriented to person, place, and time.     Cranial Nerves: No  cranial nerve deficit.     Motor: No abnormal muscle tone.     Coordination: Coordination normal.     Gait: Gait normal.     Deep Tendon Reflexes: Reflexes are normal and symmetric.  Psychiatric:        Behavior: Behavior normal.        Thought Content: Thought content normal.        Judgment: Judgment normal.   Coughing a lot Eryth throat  Lab Results  Component Value Date   WBC 7.5 03/03/2023   HGB 15.8 03/03/2023   HCT 45.3 03/03/2023   PLT 217.0 03/03/2023   GLUCOSE 111 (H) 03/14/2024   CHOL 132 04/07/2023   TRIG 108 04/07/2023   HDL 41 04/07/2023   LDLCALC 71 04/07/2023   ALT 13 03/14/2024   AST 20 03/14/2024   NA 139 03/14/2024   K 4.1 03/14/2024   CL 104 03/14/2024   CREATININE 1.33 03/14/2024   BUN 26 (H) 03/14/2024   CO2 29 03/14/2024   TSH 3.44 03/03/2023   PSA 0.07 (L) 08/26/2022   HGBA1C 6.6 (H) 03/14/2024    NM PET (PSMA) SKULL TO MID THIGH Result Date: 03/23/2023 CLINICAL DATA:  Prostate carcinoma with biochemical recurrence. EXAM: NUCLEAR MEDICINE PET SKULL BASE TO THIGH TECHNIQUE: 9.5 mCi F18 Piflufolastat (Pylarify ) was injected intravenously. Full-ring PET imaging was performed from the skull base to thigh after the radiotracer. CT data was obtained and used for attenuation correction and anatomic localization. COMPARISON:  None Available. FINDINGS: NECK No radiotracer activity in neck lymph nodes. Incidental CT finding: None. CHEST No radiotracer accumulation within mediastinal or hilar lymph nodes. No suspicious pulmonary nodules on the CT scan. Incidental CT finding: None. ABDOMEN/PELVIS Prostate: No focal activity in the prostate bed. Lymph nodes: No abnormal radiotracer accumulation within pelvic or abdominal nodes. Liver: No evidence of liver metastasis. 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 SKELETON No focal activity to suggest skeletal metastasis. No sclerotic or lytic lesions on  CT imaging. IMPRESSION: 1. No evidence of local prostate cancer recurrence in the prostatectomy bed. 2. No evidence of metastatic adenopathy in the pelvis or periaortic retroperitoneum. 3. No evidence of visceral metastasis or skeletal metastasis. Electronically Signed   By: Deboraha Fallow M.D.   On: 03/23/2023 16:08    Assessment & Plan:   Problem List Items Addressed This Visit     Prostate cancer [facility])   Dr Freddi Jaeger S/p CT/cystoscope 2025      Relevant Medications   cefdinir (OMNICEF) 300 MG capsule   Dyslipidemia   On ASA, Crestor       Chronic renal impairment, stage 3a (  HCC)   Hydrate well      Diabetes type 2, controlled (HCC)   Farxiga  - too $$$ Pt lost wt - 20 lbs on diet Monitor labs Metformin was offered      Relevant Orders   Comprehensive metabolic panel with GFR   Hemoglobin A1c   Upper respiratory infection - Primary   R/o CAP - get a CXR Omnicef po Hycodan prn Ventolin  MDI       Relevant Medications   cefdinir (OMNICEF) 300 MG capsule   Other Relevant Orders   DG Chest 2 View      Meds ordered this encounter  Medications   cefdinir (OMNICEF) 300 MG capsule    Sig: Take 1 capsule (300 mg total) by mouth 2 (two) times daily.    Dispense:  20 capsule    Refill:  0   HYDROcodone  bit-homatropine (HYCODAN) 5-1.5 MG/5ML syrup    Sig: Take 5 mLs by mouth every 8 (eight) hours as needed for cough.    Dispense:  240 mL    Refill:  0   albuterol  (VENTOLIN  HFA) 108 (90 Base) MCG/ACT inhaler    Sig: Inhale 2 puffs into the lungs every 4 (four) hours as needed for wheezing or shortness of breath.    Dispense:  8 g    Refill:  3      Follow-up: Return in about 3 months (around 06/20/2024) for a follow-up visit.  Anitra Barn, MD

## 2024-03-21 NOTE — Assessment & Plan Note (Signed)
 Hydrate well

## 2024-03-21 NOTE — Assessment & Plan Note (Signed)
 R/o CAP - get a CXR Omnicef po Hycodan prn Ventolin  MDI

## 2024-03-21 NOTE — Assessment & Plan Note (Addendum)
 Farxiga  - too $$$ Pt lost wt - 20 lbs on diet Monitor labs Metformin was offered

## 2024-03-21 NOTE — Assessment & Plan Note (Signed)
 Dr Freddi Jaeger S/p CT/cystoscope 2025

## 2024-03-22 ENCOUNTER — Encounter: Payer: Self-pay | Admitting: Internal Medicine

## 2024-03-28 ENCOUNTER — Other Ambulatory Visit (HOSPITAL_COMMUNITY): Payer: Self-pay | Admitting: Urology

## 2024-03-28 DIAGNOSIS — R9721 Rising PSA following treatment for malignant neoplasm of prostate: Secondary | ICD-10-CM

## 2024-03-30 ENCOUNTER — Other Ambulatory Visit: Payer: Self-pay | Admitting: Internal Medicine

## 2024-04-02 NOTE — Progress Notes (Signed)
 GU Location of Tumor / Histology: Prostate Ca (biochemical recurrence)  Gleason 4+4 prostate cancer on 12/06/19 by Dr. Twana Gal in [place] .   PSA 0.40 on 03/24/2024 PSA 0.18 on 09/23/2023  Radical Prostatectomy (04/14/2020)  Cecilia Coe presented as referral from Dr. Doy Gene ([facility]) elevated PSA.  04/05/2024 Dr. Doy Gene NM PET (PSMA) Skull to Mid Thigh CLINICAL DATA:  Rising PSA following treatment for prostate cancer.   IMPRESSION: 1. Subtle soft tissue fullness within the right bladder base. This area is not well evaluated by PET secondary to physiologic bladder activity. Consider cystoscopy to exclude bladder primary or metastatic/recurrent prostate primary. 2. Otherwise, no tracer avid metastatic disease identified. 3. Incidental findings, including: Coronary artery atherosclerosis. Aortic Atherosclerosis (ICD10-I70.0). Sinus disease.   10/26/2023 Dr. Doy Gene CT Abdomen and Pelvis with/without Contrast CLINICAL DATA:  Microscopic hematuria.  History of Prostate carcinoma.  *Tracking Code:  BO*   Past/Anticipated interventions by urology, if any:  NA  Past/Anticipated interventions by medical oncology, if any: NA  Weight changes, if any:  No  IPSS:  1 SHIM:  5  Bowel/Bladder complaints, if any:  No  Nausea/Vomiting, if any:  No  Pain issues, if any:   0/10  SAFETY ISSUES: Prior radiation? No Pacemaker/ICD? No Possible current pregnancy? Male Is the patient on methotrexate? No  Current Complaints / other details:

## 2024-04-05 ENCOUNTER — Encounter (HOSPITAL_COMMUNITY)
Admission: RE | Admit: 2024-04-05 | Discharge: 2024-04-05 | Disposition: A | Discharge status: Home / Self Care | Source: Ambulatory Visit | Attending: Urology | Admitting: Urology

## 2024-04-05 DIAGNOSIS — R9721 Rising PSA following treatment for malignant neoplasm of prostate: Secondary | ICD-10-CM | POA: Diagnosis present

## 2024-04-05 MED ORDER — FLOTUFOLASTAT F 18 GALLIUM 296-5846 MBQ/ML IV SOLN
8.6000 | Freq: Once | INTRAVENOUS | Status: AC
  Administered 2024-04-05: 8.6 via INTRAVENOUS
  Filled 2024-04-05: qty 9

## 2024-04-08 NOTE — Progress Notes (Signed)
 Radiation Oncology         [phone] ________________________________  Initial Outpatient Consultation  Name: Shawn Orozco MRN: [id number]  Date: 04/09/2024  DOB: 06/20/1946  GM:[id number], Shawn Bellman, MD  Lahoma Pigg, MD   REFERRING PHYSICIAN: Lahoma Pigg, MD  DIAGNOSIS: 78 y.o. gentleman with a rising PSA of 0.4 s/p RALP 03/2020 for Stage ypT3aN0 prostate cancer    ICD-10-CM   1. Malignant neoplasm of prostate (HCC)  C61     2. Prostate cancer (HCC)  C61       HISTORY OF PRESENT ILLNESS: Shawn Orozco is a 78 y.o. male with a diagnosis of biochemically recurrent prostate cancer. He was initially diagnosed with Gleason 4+4 prostate cancer on 12/06/19 by Dr. Twana Gal in [place] . Prior to deciding between radiation and surgery, the patient was recommended to proceed with neoadjuvant ADT.  He decided on RALP on 04/14/20 under Dr. Lydia Sams. Pathology revealed prostatic adenocarcinoma with treatment effect (thus, Gleason score deferred),. There was nonfocal extraprostatic extension but negative margins, seminal vesicles, and lymph nodes. His postoperative PSA was undetectable.  His PSA became barely detectable at 0.029 in 08/2021 and continued to rise. His most recent PSA from 03/29/24 was up to 0.4. He underwent PSMA PET scan on 04/05/24 showing: subtle soft tissue fullness within right bladder base, not well evaluated by PET secondary to physiologic bladder activity; otherwise, no tracer-avid metastatic disease.  The patient reviewed the pathology and PSA results with his urologist and he has kindly been referred today for discussion of potential radiation treatment options.   PREVIOUS RADIATION THERAPY: No  PAST MEDICAL HISTORY:  Past Medical History:  Diagnosis Date   Arthritis    Cataract    removed bilat 2014 with lens implants    Colon polyps    Deviated septum    GERD (gastroesophageal reflux disease)    Hyperlipidemia    preventative for FH    Prostate cancer (HCC) 2021    04-14-2020 prostate surgery       PAST SURGICAL HISTORY: Past Surgical History:  Procedure Laterality Date   CATARACT EXTRACTION, BILATERAL     COLONOSCOPY     KNEE ARTHROSCOPY Right    NASAL SEPTUM SURGERY     POLYPECTOMY     PROSTATECTOMY  04/14/2020   SHOULDER ARTHROSCOPY Right    TONSILLECTOMY      FAMILY HISTORY:  Family History  Problem Relation Age of Onset   Alzheimer's disease Mother    Alzheimer's disease Brother    Heart disease Brother    Heart disease Brother    Colon cancer Neg Hx    Colon polyps Neg Hx    Esophageal cancer Neg Hx    Rectal cancer Neg Hx    Stomach cancer Neg Hx     SOCIAL HISTORY:  Social History   Socioeconomic History   Marital status: Divorced    Spouse name: Not on file   Number of children: 0   Years of education: Not on file   Highest education level: Some college, no degree  Occupational History   Occupation: retired  Tobacco Use   Smoking status: Never   Smokeless tobacco: Never  Vaping Use   Vaping status: Never Used  Substance and Sexual Activity   Alcohol use: Never   Drug use: Never   Sexual activity: Not Currently  Other Topics Concern   Not on file  Social History Narrative   Not on file   Social Drivers of Health  Financial Resource Strain: Low Risk  (03/17/2024)   Overall Financial Resource Strain (CARDIA)    Difficulty of Paying Living Expenses: Not hard at all  Food Insecurity: No Food Insecurity (04/09/2024)   Hunger Vital Sign    Worried About Running Out of Food in the Last Year: Never true    Ran Out of Food in the Last Year: Never true  Transportation Needs: No Transportation Needs (04/09/2024)   PRAPARE - Administrator, Civil Service (Medical): No    Lack of Transportation (Non-Medical): No  Physical Activity: Sufficiently Active (03/17/2024)   Exercise Vital Sign    Days of Exercise per Week: 3 days    Minutes of Exercise per Session: 60 min  Stress: Stress Concern Present  (03/17/2024)   [employer] of Occupational Health - Occupational Stress Questionnaire    Feeling of Stress : To some extent  Social Connections: Moderately Integrated (03/17/2024)   Social Connection and Isolation Panel [NHANES]    Frequency of Communication with Friends and Family: More than three times a week    Frequency of Social Gatherings with Friends and Family: More than three times a week    Attends Religious Services: More than 4 times per year    Active Member of [employer] or Organizations: Yes    Attends Engineer, structural: More than 4 times per year    Marital Status: Divorced  Intimate Partner Violence: Not At Risk (04/09/2024)   Humiliation, Afraid, Rape, and Kick questionnaire    Fear of Current or Ex-Partner: No    Emotionally Abused: No    Physically Abused: No    Sexually Abused: No    ALLERGIES: Prednisone  MEDICATIONS:  Current Outpatient Medications  Medication Sig Dispense Refill   cholecalciferol (VITAMIN D3) 25 MCG (1000 UNIT) tablet Take 1,000 Units by mouth daily.     fluorouracil (EFUDEX) 5 % cream Apply topically 2 (two) times daily as needed.     loratadine  (CLARITIN ) 10 MG tablet Take 1 tablet (10 mg total) by mouth daily. 30 tablet 11   Niacinamide 500 MG TBCR Take 500 mg by mouth daily.     omeprazole  (PRILOSEC) 20 MG capsule Take 1 capsule (20 mg total) by mouth daily. 90 capsule 1   rosuvastatin  (CRESTOR ) 20 MG tablet Take 1 tablet (20 mg total) by mouth daily. KEEP OV. 30 tablet 0   FLONASE SENSIMIST 27.5 MCG/SPRAY nasal spray 2 sprays 2 (two) times daily.     Hypertonic Nasal Wash (SINUS RINSE) PACK Place into the nose as needed.     No current facility-administered medications for this encounter.      REVIEW OF SYSTEMS:  On review of systems, the patient reports that he is doing well overall. He denies any chest pain, shortness of breath, cough, fevers, chills, night sweats, unintended weight changes. He denies any bowel  disturbances, and denies abdominal pain, nausea or vomiting. He denies any new musculoskeletal or joint aches or pains. His IPSS was Total Score: 1, indicating mild urinary symptoms (Reference 0-7 mild, 8-19 moderate, 20-35 severe).  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 A complete review of systems is obtained and is otherwise negative.     PHYSICAL EXAM:  Wt Readings from Last 3 Encounters:  04/09/24 173 lb 6.4 oz (78.7 kg)  03/21/24 180 lb 12.8 oz (82 kg)  09/22/23 176 lb (79.8 kg)   Temp Readings from Last  3 Encounters:  04/09/24 (!) 97.5 F (36.4 C)  03/21/24 97.6 F (36.4 C)  09/21/23 97.6 F (36.4 C) (Temporal)   BP Readings from Last 3 Encounters:  04/09/24 (!) 145/97  03/21/24 110/68  09/22/23 118/78   Pulse Readings from Last 3 Encounters:  04/09/24 (!) 59  03/21/24 82  09/22/23 65    /10  In general this is a well appearing man in no acute distress. He's alert and oriented x4 and appropriate throughout the examination. Cardiopulmonary assessment is negative for acute distress, and he exhibits normal effort.     KPS = 100  100 - Normal; no complaints; no evidence of disease. 90   - Able to carry on normal activity; minor signs or symptoms of disease. 80   - Normal activity with effort; some signs or symptoms of disease. 8   - Cares for self; unable to carry on normal activity or to do active work. 60   - Requires occasional assistance, but is able to care for most of his personal needs. 50   - Requires considerable assistance and frequent medical care. 40   - Disabled; requires special care and assistance. 30   - Severely disabled; hospital admission is indicated although death not imminent. 20   - Very sick; hospital admission necessary; active supportive treatment necessary. 10   - Moribund; fatal processes progressing rapidly. 0     - Dead  Karnofsky DA, Abelmann WH,  Craver LS and Burchenal [facility] [phone]) The use of the nitrogen mustards in the palliative treatment of carcinoma: with particular reference to bronchogenic carcinoma Cancer 1 634-56  LABORATORY DATA:  Lab Results  Component Value Date   WBC 7.5 03/03/2023   HGB 15.8 03/03/2023   HCT 45.3 03/03/2023   MCV 95.3 03/03/2023   PLT 217.0 03/03/2023   Lab Results  Component Value Date   NA 137 03/21/2024   K 3.6 03/21/2024   CL 103 03/21/2024   CO2 27 03/21/2024   Lab Results  Component Value Date   ALT 13 03/21/2024   AST 16 03/21/2024   ALKPHOS 66 03/21/2024   BILITOT 1.9 (H) 03/21/2024     RADIOGRAPHY: NM PET (PSMA) SKULL TO MID THIGH Result Date: 04/06/2024 CLINICAL DATA:  Rising PSA following treatment for prostate cancer. EXAM: NUCLEAR MEDICINE PET SKULL BASE TO THIGH TECHNIQUE: 8.7 mCi Flotufolastat (Posluma ) was injected intravenously. Full-ring PET imaging was performed from the skull base to thigh after the radiotracer. CT data was obtained and used for attenuation correction and anatomic localization. COMPARISON:  03/21/2023 FINDINGS: NECK No radiotracer activity in neck lymph nodes. Incidental CT finding: Left carotid atherosclerosis. No cervical adenopathy. Minimal mucosal thickening of left maxillary sinus. CHEST No radiotracer accumulation within mediastinal or hilar lymph nodes. No suspicious pulmonary nodules on the CT scan. Incidental CT finding: Aortic and coronary artery calcification. Mild cardiomegaly. Azygous fissure, a normal anatomic variant. ABDOMEN/PELVIS Prostate: No focal activity in the prostate bed. Lymph nodes: No abnormal radiotracer accumulation within pelvic or abdominal nodes. Liver: No evidence of liver metastasis. Incidental CT finding: Normal adrenal glands. Abdominal aortic atherosclerosis. Scattered colonic diverticula. Prostatectomy. Suspect soft tissue fullness within the right bladder base, adjacent the ureteric insertion. 1.4 x 1.1 cm on 183/4. This area  ispoorly evaluated by PET secondary to physiologic urinary excretion. SKELETON No focal activity to suggest skeletal metastasis. Bilateral L5 pars defects. IMPRESSION: 1. Subtle soft tissue fullness within the right bladder base. This area is not well evaluated by PET secondary  to physiologic bladder activity. Consider cystoscopy to exclude bladder primary or metastatic/recurrent prostate primary. 2. Otherwise, no tracer avid metastatic disease identified. 3. Incidental findings, including: Coronary artery atherosclerosis. Aortic Atherosclerosis (ICD10-I70.0). Sinus disease. Electronically Signed   By: Lore Rode M.D.   On: 04/06/2024 14:58   DG Chest 2 View Result Date: 03/21/2024 CLINICAL DATA:  78 year old male with cough, congestion, shortness of breath for 1 week. EXAM: CHEST - 2 VIEW COMPARISON:  Portable chest 03/22/2022 and earlier. FINDINGS: PA and lateral views at 0858 hours. Lung volumes and mediastinal contours remain within normal limits. Visualized tracheal air column is within normal limits. No pneumothorax, pulmonary edema, pleural effusion or consolidation. Lung markings appear stable since 2023. No acute osseous abnormality identified. Negative visible bowel gas. IMPRESSION: No acute cardiopulmonary abnormality. Electronically Signed   By: Marlise Simpers M.D.   On: 03/21/2024 08:58      IMPRESSION/PLAN: 1. 78 y.o. gentleman with a rising PSA of 0.4 s/p RALP 03/2020 for Stage ypT3aN0 prostate cancer Today I reviewed the findings and workup thus far.  We discussed the natural history of prostate cancer.  We reviewed the the implications of positive margins, extracapsular extension, and seminal vesicle involvement on the risk of prostate cancer recurrence. In his case, extraprostatic extension was present. We reviewed some of the evidence suggesting an advantage for patients who undergo adjuvant radiotherapy in the setting in terms of disease control and overall survival. We discussed radiation  treatment directed to the prostatic fossa with regard to the logistics and delivery of external beam radiation treatment.  At the conclusion of our conversation, the patient is interested in moving forward with 7.5 weeks of salvage external beam therapy.  He would prefer to avoid ADT, having suffered severely from side effects of ADT previously.   We will share our discussion with Dr. Renaldo Caroli . The patient appears to have a good understanding of his disease and our treatment recommendations which are of curative/salvage intent and is in agreement with the stated plan.  Therefore, we will move forward with treatment planning accordingly, in anticipation of beginning IMRT in the near future.   I personally spent 60 minutes in this encounter including chart review, reviewing radiological studies, meeting face-to-face with the patient, entering orders and completing documentation.       Shawn Payer, MD  [facility]  Radiation Oncology Direct Dial: [phone]  Fax: [phone] [email or web address]  Skype  LinkedIn   This document serves as a record of services personally performed by Shawn Payer, MD . It was created on their behalf by Florance Hun, a trained medical scribe. The creation of this record is based on the scribe's personal observations and the provider's statements to them. This document has been checked and approved by the attending provider.

## 2024-04-09 ENCOUNTER — Encounter: Payer: Self-pay | Admitting: Radiation Oncology

## 2024-04-09 ENCOUNTER — Ambulatory Visit
Admission: RE | Admit: 2024-04-09 | Discharge: 2024-04-09 | Disposition: A | Discharge status: Home / Self Care | Source: Ambulatory Visit | Attending: Radiation Oncology | Admitting: Radiation Oncology

## 2024-04-09 VITALS — BP 145/97 | HR 59 | Temp 97.5°F | Resp 18 | Ht 72.0 in | Wt 173.4 lb

## 2024-04-09 DIAGNOSIS — M129 Arthropathy, unspecified: Secondary | ICD-10-CM | POA: Diagnosis not present

## 2024-04-09 DIAGNOSIS — C61 Malignant neoplasm of prostate: Secondary | ICD-10-CM

## 2024-04-09 DIAGNOSIS — Z860101 Personal history of adenomatous and serrated colon polyps: Secondary | ICD-10-CM | POA: Insufficient documentation

## 2024-04-09 DIAGNOSIS — K219 Gastro-esophageal reflux disease without esophagitis: Secondary | ICD-10-CM | POA: Insufficient documentation

## 2024-04-09 DIAGNOSIS — I2510 Atherosclerotic heart disease of native coronary artery without angina pectoris: Secondary | ICD-10-CM | POA: Insufficient documentation

## 2024-04-09 DIAGNOSIS — E785 Hyperlipidemia, unspecified: Secondary | ICD-10-CM | POA: Insufficient documentation

## 2024-04-09 DIAGNOSIS — K5730 Diverticulosis of large intestine without perforation or abscess without bleeding: Secondary | ICD-10-CM | POA: Insufficient documentation

## 2024-04-09 DIAGNOSIS — Z79899 Other long term (current) drug therapy: Secondary | ICD-10-CM | POA: Insufficient documentation

## 2024-04-09 DIAGNOSIS — I700 Atherosclerosis of aorta: Secondary | ICD-10-CM | POA: Diagnosis not present

## 2024-04-09 DIAGNOSIS — I6522 Occlusion and stenosis of left carotid artery: Secondary | ICD-10-CM | POA: Insufficient documentation

## 2024-04-09 NOTE — Addendum Note (Signed)
 Encounter addended by: El Gravely, RN on: 04/09/2024 11:44 AM  Actions taken: Vitals modified

## 2024-04-09 NOTE — Progress Notes (Signed)
 Introduced myself to the patient as the prostate nurse navigator.  No barriers to care identified at this time.  He is here to discuss his radiation treatment options.  I gave him my business card and asked him to call me with questions or concerns.  Verbalized understanding.  ?

## 2024-04-10 ENCOUNTER — Ambulatory Visit
Admission: RE | Admit: 2024-04-10 | Discharge: 2024-04-10 | Disposition: A | Discharge status: Home / Self Care | Source: Ambulatory Visit | Attending: Radiation Oncology | Admitting: Radiation Oncology

## 2024-04-10 DIAGNOSIS — C61 Malignant neoplasm of prostate: Secondary | ICD-10-CM | POA: Diagnosis present

## 2024-04-10 NOTE — Progress Notes (Signed)
  Radiation Oncology         [phone] ________________________________  Name: Shawn Orozco MRN: [id number]  Date: 04/10/2024  DOB: 17-Jul-1946  SIMULATION AND TREATMENT PLANNING NOTE    ICD-10-CM   1. Prostate cancer [facility])  C61       DIAGNOSIS:  78 y.o. gentleman with a rising PSA of 0.4 s/p RALP 03/2020 for Stage ypT3aN0 prostate cancer  NARRATIVE:  The patient was brought to the CT Simulation planning suite.  Identity was confirmed.  All relevant records and images related to the planned course of therapy were reviewed.  The patient freely provided informed written consent to proceed with treatment after reviewing the details related to the planned course of therapy. The consent form was witnessed and verified by the simulation staff.  Then, the patient was set-up in a stable reproducible supine position for radiation therapy.  A vacuum lock pillow device was custom fabricated to position his legs in a reproducible immobilized position.  Then, I performed a urethrogram under sterile conditions to identify the prostatic bed.  CT images were obtained.  Surface markings were placed.  The CT images were loaded into the planning software.  Then the prostate bed target, pelvic lymph node target and avoidance structures including the rectum, bladder, bowel and hips were contoured.  Treatment planning then occurred.  The radiation prescription was entered and confirmed.  A total of one complex treatment devices were fabricated. I have requested : Intensity Modulated Radiotherapy (IMRT) is medically necessary for this case for the following reason:  Rectal sparing.Aaron Aas  PLAN:  The patient will receive 45 Gy in 25 fractions of 1.8 Gy, followed by a boost to the prostate bed to a total dose of 68.4 Gy with 13 additional fractions of 1.8 Gy.   ________________________________  Trilby Fujisawa Lorri Rota, M.D.

## 2024-04-11 DIAGNOSIS — C61 Malignant neoplasm of prostate: Secondary | ICD-10-CM | POA: Diagnosis not present

## 2024-04-12 ENCOUNTER — Ambulatory Visit: Payer: Medicare Other | Attending: Internal Medicine | Admitting: Internal Medicine

## 2024-04-12 ENCOUNTER — Encounter: Payer: Self-pay | Admitting: Internal Medicine

## 2024-04-12 VITALS — BP 130/75 | HR 54 | Ht 72.0 in | Wt 180.0 lb

## 2024-04-12 DIAGNOSIS — R55 Syncope and collapse: Secondary | ICD-10-CM | POA: Diagnosis not present

## 2024-04-12 DIAGNOSIS — I2583 Coronary atherosclerosis due to lipid rich plaque: Secondary | ICD-10-CM

## 2024-04-12 DIAGNOSIS — I2510 Atherosclerotic heart disease of native coronary artery without angina pectoris: Secondary | ICD-10-CM | POA: Diagnosis not present

## 2024-04-12 DIAGNOSIS — I4510 Unspecified right bundle-branch block: Secondary | ICD-10-CM

## 2024-04-12 DIAGNOSIS — I2584 Coronary atherosclerosis due to calcified coronary lesion: Secondary | ICD-10-CM | POA: Diagnosis not present

## 2024-04-12 DIAGNOSIS — E785 Hyperlipidemia, unspecified: Secondary | ICD-10-CM | POA: Diagnosis present

## 2024-04-12 MED ORDER — ROSUVASTATIN CALCIUM 20 MG PO TABS: 20.0000 mg | ORAL_TABLET | Freq: Every day | ORAL | 3 refills | Status: AC

## 2024-04-12 NOTE — Patient Instructions (Signed)
 Medication Instructions:  Your physician recommends that you continue on your current medications as directed. Please refer to the Current Medication list given to you today.  *If you need a refill on your cardiac medications before your next appointment, please call your pharmacy*  Lab Work: NONE  If you have labs (blood work) drawn today and your tests are completely normal, you will receive your results only by: MyChart Message (if you have MyChart) OR A paper copy in the mail If you have any lab test that is abnormal or we need to change your treatment, we will call you to review the results.  Testing/Procedures: NONE  Follow-Up: At [facility], you and your health needs are our priority.  As part of our continuing mission to provide you with exceptional heart care, our providers are all part of one team.  This team includes your primary Cardiologist (physician) and Advanced Practice Providers or APPs (Physician Assistants and Nurse Practitioners) who all work together to provide you with the care you need, when you need it.  Your next appointment:   12 month(s)  Provider:   Jann Melody, MD

## 2024-04-12 NOTE — Progress Notes (Signed)
 Cardiology Office Note:  .    Date:  04/12/2024  ID:  Shawn Orozco, DOB Nov 14, 1946, MRN [id number] PCP: Genia Kettering, MD  [facility] Providers Cardiologist:  Jann Melody, MD     CC: Secondary prevention; f/u isolated CP  History of Present Illness: .    Shawn Orozco is a 78 y.o. male with coronary artery calcifications and hyperlipidemia who presents for secondary prevention follow-up.  He has a history of coronary artery calcifications and hyperlipidemia, with his LDL historically near seventy. He is on a conservative treatment regimen and is currently taking rosuvastatin  20 mg. He wants to manage his condition through diet and medication. No current chest pain, palpitations, or any recent episodes of dizziness.  He recently experienced chest pain, which he initially attributed to bronchitis. He has since recovered from the bronchitis and is no longer experiencing chest pain. A recent PET scan mentioned some heart-related findings, prompting him to seek further evaluation.  He has a history of vasovagal syncope, with a notable episode in the past where he experienced dizziness and fell. He has not had any recent episodes and is not on any blood pressure medications.  He has a history of prostate cancer and is scheduled to begin radiation therapy soon, having recently seen his radiation oncologist. He clarified that he is not undergoing chemotherapy, only radiation.  He is currently taking aspirin  81 mg as part of his medication regimen.  Discussed the use of AI scribe software for clinical note transcription with the patient, who gave verbal consent to proceed.   Relevant histories: .  Social  - Moved to be with his brother who passed October 08/2020; Remodeled that house during our time together and in 2022 finished the yard  2022 CAC LDL goal < 70 started statin 2023; Vasovagal syncope; echo was normal  ROS: As per HPI.   Studies Reviewed: .      Cardiac Studies & Procedures   ______________________________________________________________________________________________     ECHOCARDIOGRAM  ECHOCARDIOGRAM COMPLETE 04/28/2022  Narrative ECHOCARDIOGRAM REPORT    Patient Name:   Shawn Orozco   Date of Exam: 04/28/2022 Medical Rec #:  [id number]     Height:       72.0 in Accession #:    [id number]    Weight:       197.0 lb Date of Birth:  October 15, 1946    BSA:          2.117 m Patient Age:    75 years      BP:           132/76 mmHg Patient Gender: M             HR:           63 bpm. Exam Location:  [address]  Procedure: 2D Echo, 3D Echo, Cardiac Doppler, Color Doppler and Strain Analysis  Indications:    R55 Syncope  History:        Patient has no prior history of Echocardiogram examinations. Risk Factors:Dyslipidemia.  Sonographer:    Brigid Canada RDCS Referring Phys: [id number] Leilani Punter SWINYER  IMPRESSIONS   1. Left ventricular ejection fraction, by estimation, is 55 to 60%. The left ventricle has normal function. The left ventricle has no regional wall motion abnormalities. Left ventricular diastolic parameters are consistent with Grade I diastolic dysfunction (impaired relaxation). The average left ventricular global longitudinal strain is -21.5 %. The global longitudinal strain is normal. 2. Right ventricular systolic function is normal. The  right ventricular size is normal. Tricuspid regurgitation signal is inadequate for assessing PA pressure. 3. The mitral valve is grossly normal. Trivial mitral valve regurgitation. No evidence of mitral stenosis. 4. The aortic valve is tricuspid. Aortic valve regurgitation is not visualized. No aortic stenosis is present. 5. The inferior vena cava is normal in size with greater than 50% respiratory variability, suggesting right atrial pressure of 3 mmHg.  Conclusion(s)/Recommendation(s): Normal biventricular function without evidence of hemodynamically significant  valvular heart disease.  FINDINGS Left Ventricle: Left ventricular ejection fraction, by estimation, is 55 to 60%. The left ventricle has normal function. The left ventricle has no regional wall motion abnormalities. The average left ventricular global longitudinal strain is -21.5 %. The global longitudinal strain is normal. 3D left ventricular ejection fraction analysis performed but not reported based on interpreter judgement due to suboptimal tracking. The left ventricular internal cavity size was normal in size. There is no left ventricular hypertrophy. Left ventricular diastolic parameters are consistent with Grade I diastolic dysfunction (impaired relaxation).  Right Ventricle: The right ventricular size is normal. No increase in right ventricular wall thickness. Right ventricular systolic function is normal. Tricuspid regurgitation signal is inadequate for assessing PA pressure.  Left Atrium: Left atrial size was normal in size.  Right Atrium: Right atrial size was normal in size.  Pericardium: There is no evidence of pericardial effusion.  Mitral Valve: The mitral valve is grossly normal. Trivial mitral valve regurgitation. No evidence of mitral valve stenosis.  Tricuspid Valve: The tricuspid valve is grossly normal. Tricuspid valve regurgitation is not demonstrated. No evidence of tricuspid stenosis.  Aortic Valve: The aortic valve is tricuspid. Aortic valve regurgitation is not visualized. No aortic stenosis is present.  Pulmonic Valve: The pulmonic valve was grossly normal. Pulmonic valve regurgitation is not visualized. No evidence of pulmonic stenosis.  Aorta: The aortic root and ascending aorta are structurally normal, with no evidence of dilitation.  Venous: The inferior vena cava is normal in size with greater than 50% respiratory variability, suggesting right atrial pressure of 3 mmHg.  IAS/Shunts: The atrial septum is grossly normal.   LEFT VENTRICLE PLAX 2D LVIDd:          3.80 cm   Diastology LVIDs:         2.10 cm   LV e' medial:    6.71 cm/s LV PW:         1.00 cm   LV E/e' medial:  9.8 LV IVS:        1.10 cm   LV e' lateral:   7.71 cm/s LVOT diam:     2.00 cm   LV E/e' lateral: 8.6 LV SV:         82 LV SV Index:   39        2D Longitudinal Strain LVOT Area:     3.14 cm  2D Strain GLS (A2C):   -21.1 % 2D Strain GLS (A3C):   -22.0 % 2D Strain GLS (A4C):   -21.3 % 2D Strain GLS Avg:     -21.5 %  3D Volume EF: 3D EF:        53 % LV EDV:       156 ml LV ESV:       73 ml LV SV:        83 ml  RIGHT VENTRICLE             IVC RV Basal diam:  4.40 cm     IVC diam: 1.70  cm RV S prime:     13.20 cm/s TAPSE (M-mode): 2.6 cm  LEFT ATRIUM             Index        RIGHT ATRIUM           Index LA diam:        4.10 cm 1.94 cm/m   RA Area:     11.30 cm LA Vol (A2C):   42.4 ml 20.03 ml/m  RA Volume:   23.30 ml  11.01 ml/m LA Vol (A4C):   31.9 ml 15.07 ml/m LA Biplane Vol: 36.7 ml 17.34 ml/m AORTIC VALVE LVOT Vmax:   119.50 cm/s LVOT Vmean:  76.900 cm/s LVOT VTI:    0.260 m  AORTA Ao Root diam: 3.90 cm Ao Asc diam:  3.70 cm  MITRAL VALVE MV Area (PHT): 2.83 cm    SHUNTS MV Decel Time: 268 msec    Systemic VTI:  0.26 m MV E velocity: 66.00 cm/s  Systemic Diam: 2.00 cm MV A velocity: 95.40 cm/s MV E/A ratio:  0.69  Jackquelyn Mass MD Electronically signed by Jackquelyn Mass MD Signature Date/Time: 04/28/2022/11:32:10 AM    Final      CT SCANS  CT CARDIAC SCORING (SELF PAY ONLY) 12/24/2020  Addendum 12/24/2020 12:19 PM ADDENDUM REPORT: 12/24/2020 12:17  CLINICAL DATA:  Risk stratification, CAD screening, 42yr CHD risk < 10%, Dyslipidemia  EXAM: Coronary Calcium  Score  TECHNIQUE: The patient was scanned on a [employer] scanner. Axial non-contrast 3 mm slices were carried out through the heart. The data set was analyzed on a dedicated work station and scored using the Agatston method.  FINDINGS: Non-cardiac: See separate  report from [facility].  Motion artifact is present.  Ascending Aorta: 36 mm at the mid ascending aorta measured in an axial plane.  Pericardium: Normal  Coronary arteries:  Coronary calcium  score of 118. This was 36th percentile for age and sex matched control.  Calcium  seen in the proximal and mid LAD proximal L circumflex.  IMPRESSION: Coronary calcium  score of 118. This was 36th percentile for age and sex matched control.   Electronically Signed By: Grady Lawman On: 12/24/2020 12:17  Narrative EXAM: OVER-READ INTERPRETATION  CT CHEST  The following report is an over-read performed by radiologist Dr. Alexandria Angel of [facility] on 12/24/2020. This over-read does not include interpretation of cardiac or coronary anatomy or pathology. The coronary calcium  score interpretation by the cardiologist is attached.  COMPARISON:  None.  FINDINGS: Within the visualized portions of the thorax there are no suspicious appearing pulmonary nodules or masses, there is no acute consolidative airspace disease, no pleural effusions, no pneumothorax and no lymphadenopathy. Visualized portions of the upper abdomen are unremarkable. There are no aggressive appearing lytic or blastic lesions noted in the visualized portions of the skeleton.  IMPRESSION: 1. No significant incidental noncardiac findings are noted.  Electronically Signed: By: Alexandria Angel M.D. On: 12/24/2020 08:51     ______________________________________________________________________________________________         Physical Exam:    VS:  BP 130/75 (BP Location: Left Arm)   Pulse (!) 54   Ht 6' (1.829 m)   Wt 180 lb (81.6 kg)   SpO2 97%   BMI 24.41 kg/m    Wt Readings from Last 3 Encounters:  04/12/24 180 lb (81.6 kg)  04/09/24 173 lb 6.4 oz (78.7 kg)  03/21/24 180 lb 12.8 oz (82 kg)    Gen: no distress  Neck: No JVD Cardiac: No Rubs or Gallops, no murmur,  regular bradycardia, +2 radial pulses Respiratory: Clear to auscultation bilaterally, normal effort, normal  respiratory rate GI: Soft, nontender, non-distended  MS: No  edema;  moves all extremities Integument: Skin feels warm Neuro:  At time of evaluation, alert and oriented to person/place/time/situation  Psych: Normal affect, patient feels ok   ASSESSMENT AND PLAN: .     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 No current chest pain, but a recent episode possibly related to bronchitis. Future chest pain may warrant further evaluation with CT scan with contrast or heart catheterization. Cautious use of medications is advised to avoid hypotension due to vasovagal syncope. - Consider CT scan with contrast or heart catheterization if chest pain recurs. - update out list to include his ASA 81 mg PO Daily (Appropriate, CAD)  Sinus bradycardia Premature ventricular contraction (PVC) - Sinus bradycardia noted on EKG with a slower heart rate. Asymptomatic and remains active. - no additional testing at this time  Hyperlipidemia LDL levels are near target, managed with rosuvastatin  20 mg. Active in managing cholesterol levels. - Continue rosuvastatin  20 mg. - LDL is < 70, he is near this at 71; will continue aggressive lifestyle interventions  Vasovagal syncope One significant episode in the past. No recent episodes. Not on blood pressure medications. Cautious use of medications to avoid hypotension.  Prostate cancer undergoing radiation therapy Radiation therapy starting a week from the upcoming Monday for localized prostate cancer without metastasis. No chemotherapy planned. If Chemo is planned, will update echo  Bronchitis Recent episode resolved. No current symptoms or chest pain related to bronchitis.  Longitudinal care: The  evaluation and management services provided today reflect the complexity inherent in caring for this patient, including the ongoing longitudinal relationship and management of multiple chronic conditions and/or the need for care coordination. The visit required a comprehensive assessment and management plan tailored to the patient's unique needs Time was spent addressing not only the acute concerns but also the broader context of the patient's health, including preventive care, chronic disease management, and care coordination as appropriate.  Complex longitudinal is necessary for conditions including: tailored secondary prevention with background hx of orthostatic hypotension.  I have offered PRN f/u but given this hx he would like to see me in one year for aggressive prevention which is reasonable.  Gloriann Larger, MD FASE [facility] Cardiologist [facility]  [address] [place], [address] [place], [place] [address] [phone]  8:13 AM

## 2024-04-23 ENCOUNTER — Other Ambulatory Visit: Payer: Self-pay

## 2024-04-23 ENCOUNTER — Ambulatory Visit
Admission: RE | Admit: 2024-04-23 | Discharge: 2024-04-23 | Disposition: A | Discharge status: Home / Self Care | Source: Ambulatory Visit | Attending: Radiation Oncology | Admitting: Radiation Oncology

## 2024-04-23 DIAGNOSIS — Z510 Encounter for antineoplastic radiation therapy: Secondary | ICD-10-CM | POA: Diagnosis present

## 2024-04-23 DIAGNOSIS — C61 Malignant neoplasm of prostate: Secondary | ICD-10-CM | POA: Diagnosis present

## 2024-04-23 LAB — RAD ONC ARIA SESSION SUMMARY
Course Elapsed Days: 0
Plan Fractions Treated to Date: 1
Plan Prescribed Dose Per Fraction: 1.8 Gy
Plan Total Fractions Prescribed: 25
Plan Total Prescribed Dose: 45 Gy
Reference Point Dosage Given to Date: 1.8 Gy
Reference Point Session Dosage Given: 1.8 Gy
Session Number: 1

## 2024-04-24 ENCOUNTER — Other Ambulatory Visit: Payer: Self-pay

## 2024-04-24 ENCOUNTER — Ambulatory Visit
Admission: RE | Admit: 2024-04-24 | Discharge: 2024-04-24 | Disposition: A | Discharge status: Home / Self Care | Source: Ambulatory Visit | Attending: Radiation Oncology | Admitting: Radiation Oncology

## 2024-04-24 DIAGNOSIS — Z510 Encounter for antineoplastic radiation therapy: Secondary | ICD-10-CM | POA: Diagnosis not present

## 2024-04-24 LAB — RAD ONC ARIA SESSION SUMMARY
Course Elapsed Days: 1
Plan Fractions Treated to Date: 2
Plan Prescribed Dose Per Fraction: 1.8 Gy
Plan Total Fractions Prescribed: 25
Plan Total Prescribed Dose: 45 Gy
Reference Point Dosage Given to Date: 3.6 Gy
Reference Point Session Dosage Given: 1.8 Gy
Session Number: 2

## 2024-04-25 ENCOUNTER — Ambulatory Visit
Admission: RE | Admit: 2024-04-25 | Discharge: 2024-04-25 | Disposition: A | Discharge status: Home / Self Care | Source: Ambulatory Visit | Attending: Radiation Oncology | Admitting: Radiation Oncology

## 2024-04-25 ENCOUNTER — Other Ambulatory Visit: Payer: Self-pay

## 2024-04-25 DIAGNOSIS — Z510 Encounter for antineoplastic radiation therapy: Secondary | ICD-10-CM | POA: Diagnosis not present

## 2024-04-25 LAB — RAD ONC ARIA SESSION SUMMARY
Course Elapsed Days: 2
Plan Fractions Treated to Date: 3
Plan Prescribed Dose Per Fraction: 1.8 Gy
Plan Total Fractions Prescribed: 25
Plan Total Prescribed Dose: 45 Gy
Reference Point Dosage Given to Date: 5.4 Gy
Reference Point Session Dosage Given: 1.8 Gy
Session Number: 3

## 2024-04-26 ENCOUNTER — Other Ambulatory Visit: Payer: Self-pay

## 2024-04-26 ENCOUNTER — Ambulatory Visit
Admission: RE | Admit: 2024-04-26 | Discharge: 2024-04-26 | Disposition: A | Discharge status: Home / Self Care | Source: Ambulatory Visit | Attending: Radiation Oncology | Admitting: Radiation Oncology

## 2024-04-26 DIAGNOSIS — Z510 Encounter for antineoplastic radiation therapy: Secondary | ICD-10-CM | POA: Diagnosis not present

## 2024-04-26 LAB — RAD ONC ARIA SESSION SUMMARY
Course Elapsed Days: 3
Plan Fractions Treated to Date: 4
Plan Prescribed Dose Per Fraction: 1.8 Gy
Plan Total Fractions Prescribed: 25
Plan Total Prescribed Dose: 45 Gy
Reference Point Dosage Given to Date: 7.2 Gy
Reference Point Session Dosage Given: 1.8 Gy
Session Number: 4

## 2024-04-27 ENCOUNTER — Ambulatory Visit
Admission: RE | Admit: 2024-04-27 | Discharge: 2024-04-27 | Disposition: A | Discharge status: Home / Self Care | Source: Ambulatory Visit | Attending: Radiation Oncology | Admitting: Radiation Oncology

## 2024-04-27 ENCOUNTER — Other Ambulatory Visit: Payer: Self-pay

## 2024-04-27 DIAGNOSIS — Z510 Encounter for antineoplastic radiation therapy: Secondary | ICD-10-CM | POA: Diagnosis not present

## 2024-04-27 LAB — RAD ONC ARIA SESSION SUMMARY
Course Elapsed Days: 4
Plan Fractions Treated to Date: 5
Plan Prescribed Dose Per Fraction: 1.8 Gy
Plan Total Fractions Prescribed: 25
Plan Total Prescribed Dose: 45 Gy
Reference Point Dosage Given to Date: 9 Gy
Reference Point Session Dosage Given: 1.8 Gy
Session Number: 5

## 2024-04-30 ENCOUNTER — Other Ambulatory Visit: Payer: Self-pay

## 2024-04-30 ENCOUNTER — Ambulatory Visit
Admission: RE | Admit: 2024-04-30 | Discharge: 2024-04-30 | Disposition: A | Discharge status: Home / Self Care | Source: Ambulatory Visit | Attending: Radiation Oncology | Admitting: Radiation Oncology

## 2024-04-30 DIAGNOSIS — Z510 Encounter for antineoplastic radiation therapy: Secondary | ICD-10-CM | POA: Diagnosis not present

## 2024-04-30 LAB — RAD ONC ARIA SESSION SUMMARY
Course Elapsed Days: 7
Plan Fractions Treated to Date: 6
Plan Prescribed Dose Per Fraction: 1.8 Gy
Plan Total Fractions Prescribed: 25
Plan Total Prescribed Dose: 45 Gy
Reference Point Dosage Given to Date: 10.8 Gy
Reference Point Session Dosage Given: 1.8 Gy
Session Number: 6

## 2024-05-01 ENCOUNTER — Ambulatory Visit
Admission: RE | Admit: 2024-05-01 | Discharge: 2024-05-01 | Disposition: A | Discharge status: Home / Self Care | Source: Ambulatory Visit | Attending: Radiation Oncology | Admitting: Radiation Oncology

## 2024-05-01 ENCOUNTER — Other Ambulatory Visit: Payer: Self-pay

## 2024-05-01 DIAGNOSIS — Z510 Encounter for antineoplastic radiation therapy: Secondary | ICD-10-CM | POA: Diagnosis not present

## 2024-05-01 LAB — RAD ONC ARIA SESSION SUMMARY
Course Elapsed Days: 8
Plan Fractions Treated to Date: 7
Plan Prescribed Dose Per Fraction: 1.8 Gy
Plan Total Fractions Prescribed: 25
Plan Total Prescribed Dose: 45 Gy
Reference Point Dosage Given to Date: 12.6 Gy
Reference Point Session Dosage Given: 1.8 Gy
Session Number: 7

## 2024-05-02 ENCOUNTER — Ambulatory Visit
Admission: RE | Admit: 2024-05-02 | Discharge: 2024-05-02 | Disposition: A | Discharge status: Home / Self Care | Source: Ambulatory Visit | Attending: Radiation Oncology | Admitting: Radiation Oncology

## 2024-05-02 ENCOUNTER — Other Ambulatory Visit: Payer: Self-pay

## 2024-05-02 DIAGNOSIS — Z510 Encounter for antineoplastic radiation therapy: Secondary | ICD-10-CM | POA: Diagnosis not present

## 2024-05-02 LAB — RAD ONC ARIA SESSION SUMMARY
Course Elapsed Days: 9
Plan Fractions Treated to Date: 8
Plan Prescribed Dose Per Fraction: 1.8 Gy
Plan Total Fractions Prescribed: 25
Plan Total Prescribed Dose: 45 Gy
Reference Point Dosage Given to Date: 14.4 Gy
Reference Point Session Dosage Given: 1.8 Gy
Session Number: 8

## 2024-05-03 ENCOUNTER — Ambulatory Visit
Admission: RE | Admit: 2024-05-03 | Discharge: 2024-05-03 | Disposition: A | Discharge status: Home / Self Care | Source: Ambulatory Visit | Attending: Radiation Oncology | Admitting: Radiation Oncology

## 2024-05-03 ENCOUNTER — Other Ambulatory Visit: Payer: Self-pay

## 2024-05-03 DIAGNOSIS — Z510 Encounter for antineoplastic radiation therapy: Secondary | ICD-10-CM | POA: Diagnosis not present

## 2024-05-03 LAB — RAD ONC ARIA SESSION SUMMARY
Course Elapsed Days: 10
Plan Fractions Treated to Date: 9
Plan Prescribed Dose Per Fraction: 1.8 Gy
Plan Total Fractions Prescribed: 25
Plan Total Prescribed Dose: 45 Gy
Reference Point Dosage Given to Date: 16.2 Gy
Reference Point Session Dosage Given: 1.8 Gy
Session Number: 9

## 2024-05-04 ENCOUNTER — Other Ambulatory Visit: Payer: Self-pay

## 2024-05-04 ENCOUNTER — Ambulatory Visit
Admission: RE | Admit: 2024-05-04 | Discharge: 2024-05-04 | Disposition: A | Discharge status: Home / Self Care | Source: Ambulatory Visit | Attending: Radiation Oncology | Admitting: Radiation Oncology

## 2024-05-04 ENCOUNTER — Ambulatory Visit
Admission: RE | Admit: 2024-05-04 | Discharge: 2024-05-04 | Disposition: A | Discharge status: Home / Self Care | Source: Ambulatory Visit | Attending: Radiation Oncology

## 2024-05-04 DIAGNOSIS — Z510 Encounter for antineoplastic radiation therapy: Secondary | ICD-10-CM | POA: Diagnosis not present

## 2024-05-04 LAB — RAD ONC ARIA SESSION SUMMARY
Course Elapsed Days: 11
Plan Fractions Treated to Date: 10
Plan Prescribed Dose Per Fraction: 1.8 Gy
Plan Total Fractions Prescribed: 25
Plan Total Prescribed Dose: 45 Gy
Reference Point Dosage Given to Date: 18 Gy
Reference Point Session Dosage Given: 1.8 Gy
Session Number: 10

## 2024-05-07 ENCOUNTER — Ambulatory Visit
Admission: RE | Admit: 2024-05-07 | Discharge: 2024-05-07 | Disposition: A | Discharge status: Home / Self Care | Source: Ambulatory Visit | Attending: Radiation Oncology | Admitting: Radiation Oncology

## 2024-05-07 ENCOUNTER — Other Ambulatory Visit: Payer: Self-pay

## 2024-05-07 DIAGNOSIS — Z510 Encounter for antineoplastic radiation therapy: Secondary | ICD-10-CM | POA: Diagnosis not present

## 2024-05-07 LAB — RAD ONC ARIA SESSION SUMMARY
Course Elapsed Days: 14
Plan Fractions Treated to Date: 11
Plan Prescribed Dose Per Fraction: 1.8 Gy
Plan Total Fractions Prescribed: 25
Plan Total Prescribed Dose: 45 Gy
Reference Point Dosage Given to Date: 19.8 Gy
Reference Point Session Dosage Given: 1.8 Gy
Session Number: 11

## 2024-05-08 ENCOUNTER — Other Ambulatory Visit: Payer: Self-pay

## 2024-05-08 ENCOUNTER — Ambulatory Visit
Admission: RE | Admit: 2024-05-08 | Discharge: 2024-05-08 | Disposition: A | Discharge status: Home / Self Care | Source: Ambulatory Visit | Attending: Radiation Oncology | Admitting: Radiation Oncology

## 2024-05-08 DIAGNOSIS — Z510 Encounter for antineoplastic radiation therapy: Secondary | ICD-10-CM | POA: Diagnosis not present

## 2024-05-08 LAB — RAD ONC ARIA SESSION SUMMARY
Course Elapsed Days: 15
Plan Fractions Treated to Date: 12
Plan Prescribed Dose Per Fraction: 1.8 Gy
Plan Total Fractions Prescribed: 25
Plan Total Prescribed Dose: 45 Gy
Reference Point Dosage Given to Date: 21.6 Gy
Reference Point Session Dosage Given: 1.8 Gy
Session Number: 12

## 2024-05-09 ENCOUNTER — Other Ambulatory Visit: Payer: Self-pay

## 2024-05-09 ENCOUNTER — Ambulatory Visit
Admission: RE | Admit: 2024-05-09 | Discharge: 2024-05-09 | Disposition: A | Discharge status: Home / Self Care | Source: Ambulatory Visit | Attending: Radiation Oncology | Admitting: Radiation Oncology

## 2024-05-09 DIAGNOSIS — Z510 Encounter for antineoplastic radiation therapy: Secondary | ICD-10-CM | POA: Diagnosis not present

## 2024-05-09 LAB — RAD ONC ARIA SESSION SUMMARY
Course Elapsed Days: 16
Plan Fractions Treated to Date: 13
Plan Prescribed Dose Per Fraction: 1.8 Gy
Plan Total Fractions Prescribed: 25
Plan Total Prescribed Dose: 45 Gy
Reference Point Dosage Given to Date: 23.4 Gy
Reference Point Session Dosage Given: 1.8 Gy
Session Number: 13

## 2024-05-10 ENCOUNTER — Ambulatory Visit
Admission: RE | Admit: 2024-05-10 | Discharge: 2024-05-10 | Disposition: A | Discharge status: Home / Self Care | Source: Ambulatory Visit | Attending: Radiation Oncology

## 2024-05-10 ENCOUNTER — Other Ambulatory Visit: Payer: Self-pay

## 2024-05-10 DIAGNOSIS — Z510 Encounter for antineoplastic radiation therapy: Secondary | ICD-10-CM | POA: Diagnosis not present

## 2024-05-10 LAB — RAD ONC ARIA SESSION SUMMARY
Course Elapsed Days: 17
Plan Fractions Treated to Date: 14
Plan Prescribed Dose Per Fraction: 1.8 Gy
Plan Total Fractions Prescribed: 25
Plan Total Prescribed Dose: 45 Gy
Reference Point Dosage Given to Date: 25.2 Gy
Reference Point Session Dosage Given: 1.8 Gy
Session Number: 14

## 2024-05-11 ENCOUNTER — Other Ambulatory Visit: Payer: Self-pay

## 2024-05-11 ENCOUNTER — Ambulatory Visit
Admission: RE | Admit: 2024-05-11 | Discharge: 2024-05-11 | Disposition: A | Discharge status: Home / Self Care | Source: Ambulatory Visit | Attending: Radiation Oncology | Admitting: Radiation Oncology

## 2024-05-11 DIAGNOSIS — Z510 Encounter for antineoplastic radiation therapy: Secondary | ICD-10-CM | POA: Diagnosis not present

## 2024-05-11 LAB — RAD ONC ARIA SESSION SUMMARY
Course Elapsed Days: 18
Plan Fractions Treated to Date: 15
Plan Prescribed Dose Per Fraction: 1.8 Gy
Plan Total Fractions Prescribed: 25
Plan Total Prescribed Dose: 45 Gy
Reference Point Dosage Given to Date: 27 Gy
Reference Point Session Dosage Given: 1.8 Gy
Session Number: 15

## 2024-05-14 ENCOUNTER — Other Ambulatory Visit: Payer: Self-pay

## 2024-05-14 ENCOUNTER — Ambulatory Visit
Admission: RE | Admit: 2024-05-14 | Discharge: 2024-05-14 | Disposition: A | Discharge status: Home / Self Care | Source: Ambulatory Visit | Attending: Radiation Oncology

## 2024-05-14 DIAGNOSIS — Z510 Encounter for antineoplastic radiation therapy: Secondary | ICD-10-CM | POA: Diagnosis not present

## 2024-05-14 LAB — RAD ONC ARIA SESSION SUMMARY
Course Elapsed Days: 21
Plan Fractions Treated to Date: 16
Plan Prescribed Dose Per Fraction: 1.8 Gy
Plan Total Fractions Prescribed: 25
Plan Total Prescribed Dose: 45 Gy
Reference Point Dosage Given to Date: 28.8 Gy
Reference Point Session Dosage Given: 1.8 Gy
Session Number: 16

## 2024-05-15 ENCOUNTER — Other Ambulatory Visit: Payer: Self-pay

## 2024-05-15 ENCOUNTER — Ambulatory Visit
Admission: RE | Admit: 2024-05-15 | Discharge: 2024-05-15 | Disposition: A | Discharge status: Home / Self Care | Source: Ambulatory Visit | Attending: Radiation Oncology | Admitting: Radiation Oncology

## 2024-05-15 DIAGNOSIS — Z510 Encounter for antineoplastic radiation therapy: Secondary | ICD-10-CM | POA: Diagnosis not present

## 2024-05-15 LAB — RAD ONC ARIA SESSION SUMMARY
Course Elapsed Days: 22
Plan Fractions Treated to Date: 17
Plan Prescribed Dose Per Fraction: 1.8 Gy
Plan Total Fractions Prescribed: 25
Plan Total Prescribed Dose: 45 Gy
Reference Point Dosage Given to Date: 30.6 Gy
Reference Point Session Dosage Given: 1.8 Gy
Session Number: 17

## 2024-05-16 ENCOUNTER — Ambulatory Visit
Admission: RE | Admit: 2024-05-16 | Discharge: 2024-05-16 | Disposition: A | Discharge status: Home / Self Care | Source: Ambulatory Visit | Attending: Radiation Oncology

## 2024-05-16 ENCOUNTER — Other Ambulatory Visit: Payer: Self-pay

## 2024-05-16 DIAGNOSIS — Z510 Encounter for antineoplastic radiation therapy: Secondary | ICD-10-CM | POA: Diagnosis not present

## 2024-05-16 LAB — RAD ONC ARIA SESSION SUMMARY
Course Elapsed Days: 23
Plan Fractions Treated to Date: 18
Plan Prescribed Dose Per Fraction: 1.8 Gy
Plan Total Fractions Prescribed: 25
Plan Total Prescribed Dose: 45 Gy
Reference Point Dosage Given to Date: 32.4 Gy
Reference Point Session Dosage Given: 1.8 Gy
Session Number: 18

## 2024-05-17 ENCOUNTER — Other Ambulatory Visit: Payer: Self-pay

## 2024-05-17 ENCOUNTER — Ambulatory Visit
Admission: RE | Admit: 2024-05-17 | Discharge: 2024-05-17 | Disposition: A | Discharge status: Home / Self Care | Source: Ambulatory Visit | Attending: Radiation Oncology | Admitting: Radiation Oncology

## 2024-05-17 DIAGNOSIS — Z510 Encounter for antineoplastic radiation therapy: Secondary | ICD-10-CM | POA: Diagnosis not present

## 2024-05-17 LAB — RAD ONC ARIA SESSION SUMMARY
Course Elapsed Days: 24
Plan Fractions Treated to Date: 19
Plan Prescribed Dose Per Fraction: 1.8 Gy
Plan Total Fractions Prescribed: 25
Plan Total Prescribed Dose: 45 Gy
Reference Point Dosage Given to Date: 34.2 Gy
Reference Point Session Dosage Given: 1.8 Gy
Session Number: 19

## 2024-05-18 ENCOUNTER — Ambulatory Visit
Admission: RE | Admit: 2024-05-18 | Discharge: 2024-05-18 | Disposition: A | Discharge status: Home / Self Care | Source: Ambulatory Visit | Attending: Radiation Oncology | Admitting: Radiation Oncology

## 2024-05-18 ENCOUNTER — Ambulatory Visit
Admission: RE | Admit: 2024-05-18 | Discharge: 2024-05-18 | Disposition: A | Discharge status: Home / Self Care | Source: Ambulatory Visit | Attending: Radiation Oncology

## 2024-05-18 ENCOUNTER — Other Ambulatory Visit: Payer: Self-pay

## 2024-05-18 DIAGNOSIS — Z510 Encounter for antineoplastic radiation therapy: Secondary | ICD-10-CM | POA: Diagnosis not present

## 2024-05-18 LAB — RAD ONC ARIA SESSION SUMMARY
Course Elapsed Days: 25
Plan Fractions Treated to Date: 20
Plan Prescribed Dose Per Fraction: 1.8 Gy
Plan Total Fractions Prescribed: 25
Plan Total Prescribed Dose: 45 Gy
Reference Point Dosage Given to Date: 36 Gy
Reference Point Session Dosage Given: 1.8 Gy
Session Number: 20

## 2024-05-21 ENCOUNTER — Other Ambulatory Visit: Payer: Self-pay

## 2024-05-21 ENCOUNTER — Ambulatory Visit
Admission: RE | Admit: 2024-05-21 | Discharge: 2024-05-21 | Disposition: A | Discharge status: Home / Self Care | Source: Ambulatory Visit | Attending: Radiation Oncology

## 2024-05-21 DIAGNOSIS — Z510 Encounter for antineoplastic radiation therapy: Secondary | ICD-10-CM | POA: Diagnosis not present

## 2024-05-21 LAB — RAD ONC ARIA SESSION SUMMARY
Course Elapsed Days: 28
Plan Fractions Treated to Date: 21
Plan Prescribed Dose Per Fraction: 1.8 Gy
Plan Total Fractions Prescribed: 25
Plan Total Prescribed Dose: 45 Gy
Reference Point Dosage Given to Date: 37.8 Gy
Reference Point Session Dosage Given: 1.8 Gy
Session Number: 21

## 2024-05-22 ENCOUNTER — Ambulatory Visit
Admission: RE | Admit: 2024-05-22 | Discharge: 2024-05-22 | Disposition: A | Discharge status: Home / Self Care | Source: Ambulatory Visit | Attending: Radiation Oncology | Admitting: Radiation Oncology

## 2024-05-22 ENCOUNTER — Other Ambulatory Visit: Payer: Self-pay

## 2024-05-22 DIAGNOSIS — C61 Malignant neoplasm of prostate: Secondary | ICD-10-CM | POA: Diagnosis present

## 2024-05-22 DIAGNOSIS — Z510 Encounter for antineoplastic radiation therapy: Secondary | ICD-10-CM | POA: Diagnosis present

## 2024-05-22 LAB — RAD ONC ARIA SESSION SUMMARY
Course Elapsed Days: 29
Plan Fractions Treated to Date: 22
Plan Prescribed Dose Per Fraction: 1.8 Gy
Plan Total Fractions Prescribed: 25
Plan Total Prescribed Dose: 45 Gy
Reference Point Dosage Given to Date: 39.6 Gy
Reference Point Session Dosage Given: 1.8 Gy
Session Number: 22

## 2024-05-23 ENCOUNTER — Other Ambulatory Visit: Payer: Self-pay

## 2024-05-23 ENCOUNTER — Ambulatory Visit
Admission: RE | Admit: 2024-05-23 | Discharge: 2024-05-23 | Disposition: A | Discharge status: Home / Self Care | Source: Ambulatory Visit | Attending: Radiation Oncology | Admitting: Radiation Oncology

## 2024-05-23 DIAGNOSIS — Z510 Encounter for antineoplastic radiation therapy: Secondary | ICD-10-CM | POA: Diagnosis not present

## 2024-05-23 LAB — RAD ONC ARIA SESSION SUMMARY
Course Elapsed Days: 30
Plan Fractions Treated to Date: 23
Plan Prescribed Dose Per Fraction: 1.8 Gy
Plan Total Fractions Prescribed: 25
Plan Total Prescribed Dose: 45 Gy
Reference Point Dosage Given to Date: 41.4 Gy
Reference Point Session Dosage Given: 1.8 Gy
Session Number: 23

## 2024-05-24 ENCOUNTER — Ambulatory Visit
Admission: RE | Admit: 2024-05-24 | Discharge: 2024-05-24 | Disposition: A | Discharge status: Home / Self Care | Source: Ambulatory Visit | Attending: Radiation Oncology | Admitting: Radiation Oncology

## 2024-05-24 ENCOUNTER — Other Ambulatory Visit: Payer: Self-pay

## 2024-05-24 DIAGNOSIS — Z510 Encounter for antineoplastic radiation therapy: Secondary | ICD-10-CM | POA: Diagnosis not present

## 2024-05-24 LAB — RAD ONC ARIA SESSION SUMMARY
Course Elapsed Days: 31
Plan Fractions Treated to Date: 24
Plan Prescribed Dose Per Fraction: 1.8 Gy
Plan Total Fractions Prescribed: 25
Plan Total Prescribed Dose: 45 Gy
Reference Point Dosage Given to Date: 43.2 Gy
Reference Point Session Dosage Given: 1.8 Gy
Session Number: 24

## 2024-05-28 ENCOUNTER — Other Ambulatory Visit: Payer: Self-pay

## 2024-05-28 ENCOUNTER — Ambulatory Visit
Admission: RE | Admit: 2024-05-28 | Discharge: 2024-05-28 | Disposition: A | Discharge status: Home / Self Care | Source: Ambulatory Visit | Attending: Radiation Oncology | Admitting: Radiation Oncology

## 2024-05-28 DIAGNOSIS — Z510 Encounter for antineoplastic radiation therapy: Secondary | ICD-10-CM | POA: Diagnosis not present

## 2024-05-28 LAB — RAD ONC ARIA SESSION SUMMARY
Course Elapsed Days: 35
Plan Fractions Treated to Date: 25
Plan Prescribed Dose Per Fraction: 1.8 Gy
Plan Total Fractions Prescribed: 25
Plan Total Prescribed Dose: 45 Gy
Reference Point Dosage Given to Date: 45 Gy
Reference Point Session Dosage Given: 1.8 Gy
Session Number: 25

## 2024-05-29 ENCOUNTER — Ambulatory Visit
Admission: RE | Admit: 2024-05-29 | Discharge: 2024-05-29 | Disposition: A | Discharge status: Home / Self Care | Source: Ambulatory Visit | Attending: Radiation Oncology

## 2024-05-29 ENCOUNTER — Other Ambulatory Visit: Payer: Self-pay

## 2024-05-29 DIAGNOSIS — Z510 Encounter for antineoplastic radiation therapy: Secondary | ICD-10-CM | POA: Diagnosis not present

## 2024-05-29 LAB — RAD ONC ARIA SESSION SUMMARY
Course Elapsed Days: 36
Plan Fractions Treated to Date: 1
Plan Prescribed Dose Per Fraction: 1.8 Gy
Plan Total Fractions Prescribed: 13
Plan Total Prescribed Dose: 23.4 Gy
Reference Point Dosage Given to Date: 1.8 Gy
Reference Point Session Dosage Given: 1.8 Gy
Session Number: 26

## 2024-05-30 ENCOUNTER — Other Ambulatory Visit: Payer: Self-pay

## 2024-05-30 ENCOUNTER — Ambulatory Visit
Admission: RE | Admit: 2024-05-30 | Discharge: 2024-05-30 | Disposition: A | Discharge status: Home / Self Care | Source: Ambulatory Visit | Attending: Radiation Oncology | Admitting: Radiation Oncology

## 2024-05-30 DIAGNOSIS — Z510 Encounter for antineoplastic radiation therapy: Secondary | ICD-10-CM | POA: Diagnosis not present

## 2024-05-30 LAB — RAD ONC ARIA SESSION SUMMARY
Course Elapsed Days: 37
Plan Fractions Treated to Date: 2
Plan Prescribed Dose Per Fraction: 1.8 Gy
Plan Total Fractions Prescribed: 13
Plan Total Prescribed Dose: 23.4 Gy
Reference Point Dosage Given to Date: 3.6 Gy
Reference Point Session Dosage Given: 1.8 Gy
Session Number: 27

## 2024-05-31 ENCOUNTER — Ambulatory Visit

## 2024-05-31 ENCOUNTER — Ambulatory Visit
Admission: RE | Admit: 2024-05-31 | Discharge: 2024-05-31 | Disposition: A | Discharge status: Home / Self Care | Source: Ambulatory Visit | Attending: Radiation Oncology | Admitting: Radiation Oncology

## 2024-05-31 ENCOUNTER — Other Ambulatory Visit: Payer: Self-pay

## 2024-05-31 DIAGNOSIS — Z510 Encounter for antineoplastic radiation therapy: Secondary | ICD-10-CM | POA: Diagnosis not present

## 2024-05-31 LAB — RAD ONC ARIA SESSION SUMMARY
Course Elapsed Days: 38
Plan Fractions Treated to Date: 3
Plan Prescribed Dose Per Fraction: 1.8 Gy
Plan Total Fractions Prescribed: 13
Plan Total Prescribed Dose: 23.4 Gy
Reference Point Dosage Given to Date: 5.4 Gy
Reference Point Session Dosage Given: 1.8 Gy
Session Number: 28

## 2024-06-01 ENCOUNTER — Ambulatory Visit
Admission: RE | Admit: 2024-06-01 | Discharge: 2024-06-01 | Disposition: A | Discharge status: Home / Self Care | Source: Ambulatory Visit | Attending: Radiation Oncology

## 2024-06-01 ENCOUNTER — Other Ambulatory Visit: Payer: Self-pay

## 2024-06-01 ENCOUNTER — Ambulatory Visit
Admission: RE | Admit: 2024-06-01 | Discharge: 2024-06-01 | Disposition: A | Discharge status: Home / Self Care | Source: Ambulatory Visit | Attending: Radiation Oncology | Admitting: Radiation Oncology

## 2024-06-01 DIAGNOSIS — Z510 Encounter for antineoplastic radiation therapy: Secondary | ICD-10-CM | POA: Diagnosis not present

## 2024-06-01 LAB — RAD ONC ARIA SESSION SUMMARY
Course Elapsed Days: 39
Plan Fractions Treated to Date: 4
Plan Prescribed Dose Per Fraction: 1.8 Gy
Plan Total Fractions Prescribed: 13
Plan Total Prescribed Dose: 23.4 Gy
Reference Point Dosage Given to Date: 7.2 Gy
Reference Point Session Dosage Given: 1.8 Gy
Session Number: 29

## 2024-06-04 ENCOUNTER — Other Ambulatory Visit: Payer: Self-pay

## 2024-06-04 ENCOUNTER — Ambulatory Visit
Admission: RE | Admit: 2024-06-04 | Discharge: 2024-06-04 | Disposition: A | Discharge status: Home / Self Care | Source: Ambulatory Visit | Attending: Radiation Oncology

## 2024-06-04 DIAGNOSIS — Z510 Encounter for antineoplastic radiation therapy: Secondary | ICD-10-CM | POA: Diagnosis not present

## 2024-06-04 LAB — RAD ONC ARIA SESSION SUMMARY
Course Elapsed Days: 42
Plan Fractions Treated to Date: 5
Plan Prescribed Dose Per Fraction: 1.8 Gy
Plan Total Fractions Prescribed: 13
Plan Total Prescribed Dose: 23.4 Gy
Reference Point Dosage Given to Date: 9 Gy
Reference Point Session Dosage Given: 1.8 Gy
Session Number: 30

## 2024-06-05 ENCOUNTER — Ambulatory Visit
Admission: RE | Admit: 2024-06-05 | Discharge: 2024-06-05 | Disposition: A | Discharge status: Home / Self Care | Source: Ambulatory Visit | Attending: Radiation Oncology | Admitting: Radiation Oncology

## 2024-06-05 ENCOUNTER — Other Ambulatory Visit: Payer: Self-pay

## 2024-06-05 DIAGNOSIS — Z510 Encounter for antineoplastic radiation therapy: Secondary | ICD-10-CM | POA: Diagnosis not present

## 2024-06-05 LAB — RAD ONC ARIA SESSION SUMMARY
Course Elapsed Days: 43
Plan Fractions Treated to Date: 6
Plan Prescribed Dose Per Fraction: 1.8 Gy
Plan Total Fractions Prescribed: 13
Plan Total Prescribed Dose: 23.4 Gy
Reference Point Dosage Given to Date: 10.8 Gy
Reference Point Session Dosage Given: 1.8 Gy
Session Number: 31

## 2024-06-06 ENCOUNTER — Ambulatory Visit
Admission: RE | Admit: 2024-06-06 | Discharge: 2024-06-06 | Disposition: A | Discharge status: Home / Self Care | Source: Ambulatory Visit | Attending: Radiation Oncology | Admitting: Radiation Oncology

## 2024-06-06 ENCOUNTER — Other Ambulatory Visit: Payer: Self-pay

## 2024-06-06 DIAGNOSIS — Z510 Encounter for antineoplastic radiation therapy: Secondary | ICD-10-CM | POA: Diagnosis not present

## 2024-06-06 LAB — RAD ONC ARIA SESSION SUMMARY
Course Elapsed Days: 44
Plan Fractions Treated to Date: 7
Plan Prescribed Dose Per Fraction: 1.8 Gy
Plan Total Fractions Prescribed: 13
Plan Total Prescribed Dose: 23.4 Gy
Reference Point Dosage Given to Date: 12.6 Gy
Reference Point Session Dosage Given: 1.8 Gy
Session Number: 32

## 2024-06-07 ENCOUNTER — Ambulatory Visit
Admission: RE | Admit: 2024-06-07 | Discharge: 2024-06-07 | Disposition: A | Discharge status: Home / Self Care | Source: Ambulatory Visit | Attending: Radiation Oncology | Admitting: Radiation Oncology

## 2024-06-07 ENCOUNTER — Other Ambulatory Visit: Payer: Self-pay

## 2024-06-07 DIAGNOSIS — Z510 Encounter for antineoplastic radiation therapy: Secondary | ICD-10-CM | POA: Diagnosis not present

## 2024-06-07 LAB — RAD ONC ARIA SESSION SUMMARY
Course Elapsed Days: 45
Plan Fractions Treated to Date: 8
Plan Prescribed Dose Per Fraction: 1.8 Gy
Plan Total Fractions Prescribed: 13
Plan Total Prescribed Dose: 23.4 Gy
Reference Point Dosage Given to Date: 14.4 Gy
Reference Point Session Dosage Given: 1.8 Gy
Session Number: 33

## 2024-06-08 ENCOUNTER — Other Ambulatory Visit: Payer: Self-pay

## 2024-06-08 ENCOUNTER — Ambulatory Visit
Admission: RE | Admit: 2024-06-08 | Discharge: 2024-06-08 | Disposition: A | Discharge status: Home / Self Care | Source: Ambulatory Visit | Attending: Radiation Oncology | Admitting: Radiation Oncology

## 2024-06-08 DIAGNOSIS — Z510 Encounter for antineoplastic radiation therapy: Secondary | ICD-10-CM | POA: Diagnosis not present

## 2024-06-08 LAB — RAD ONC ARIA SESSION SUMMARY
Course Elapsed Days: 46
Plan Fractions Treated to Date: 9
Plan Prescribed Dose Per Fraction: 1.8 Gy
Plan Total Fractions Prescribed: 13
Plan Total Prescribed Dose: 23.4 Gy
Reference Point Dosage Given to Date: 16.2 Gy
Reference Point Session Dosage Given: 1.8 Gy
Session Number: 34

## 2024-06-11 ENCOUNTER — Other Ambulatory Visit: Payer: Self-pay

## 2024-06-11 ENCOUNTER — Telehealth: Payer: Self-pay

## 2024-06-11 ENCOUNTER — Ambulatory Visit
Admission: RE | Admit: 2024-06-11 | Discharge: 2024-06-11 | Disposition: A | Discharge status: Home / Self Care | Source: Ambulatory Visit | Attending: Radiation Oncology | Admitting: Radiation Oncology

## 2024-06-11 DIAGNOSIS — E785 Hyperlipidemia, unspecified: Secondary | ICD-10-CM

## 2024-06-11 DIAGNOSIS — C61 Malignant neoplasm of prostate: Secondary | ICD-10-CM

## 2024-06-11 DIAGNOSIS — Z510 Encounter for antineoplastic radiation therapy: Secondary | ICD-10-CM | POA: Diagnosis not present

## 2024-06-11 DIAGNOSIS — E118 Type 2 diabetes mellitus with unspecified complications: Secondary | ICD-10-CM

## 2024-06-11 LAB — RAD ONC ARIA SESSION SUMMARY
Course Elapsed Days: 49
Plan Fractions Treated to Date: 10
Plan Prescribed Dose Per Fraction: 1.8 Gy
Plan Total Fractions Prescribed: 13
Plan Total Prescribed Dose: 23.4 Gy
Reference Point Dosage Given to Date: 18 Gy
Reference Point Session Dosage Given: 1.8 Gy
Session Number: 35

## 2024-06-11 NOTE — Telephone Encounter (Signed)
 Copied from CRM [id number]. Topic: Clinical - Request for Lab/Test Order >> Jun 11, 2024  8:37 AM Donna BRAVO wrote: Reason for CRM: patient calling requesting lab order and scheduled the week before appt with Dr Solomon on 06/20/24  Please call patient when labs have been entered

## 2024-06-12 ENCOUNTER — Ambulatory Visit
Admission: RE | Admit: 2024-06-12 | Discharge: 2024-06-12 | Discharge status: Home / Self Care | Source: Ambulatory Visit | Attending: Radiation Oncology

## 2024-06-12 ENCOUNTER — Other Ambulatory Visit: Payer: Self-pay

## 2024-06-12 DIAGNOSIS — Z510 Encounter for antineoplastic radiation therapy: Secondary | ICD-10-CM | POA: Diagnosis not present

## 2024-06-12 LAB — RAD ONC ARIA SESSION SUMMARY
Course Elapsed Days: 50
Plan Fractions Treated to Date: 11
Plan Prescribed Dose Per Fraction: 1.8 Gy
Plan Total Fractions Prescribed: 13
Plan Total Prescribed Dose: 23.4 Gy
Reference Point Dosage Given to Date: 19.8 Gy
Reference Point Session Dosage Given: 1.8 Gy
Session Number: 36

## 2024-06-12 NOTE — Addendum Note (Signed)
 Addended by: HEDDY IP R on: 06/12/2024 12:31 PM   Modules accepted: Orders

## 2024-06-12 NOTE — Telephone Encounter (Signed)
 I was able to speak with the pt and inform him that his labs have been ordered

## 2024-06-13 ENCOUNTER — Other Ambulatory Visit (INDEPENDENT_AMBULATORY_CARE_PROVIDER_SITE_OTHER)

## 2024-06-13 ENCOUNTER — Other Ambulatory Visit: Payer: Self-pay

## 2024-06-13 ENCOUNTER — Ambulatory Visit
Admission: RE | Admit: 2024-06-13 | Discharge: 2024-06-13 | Disposition: A | Discharge status: Home / Self Care | Source: Ambulatory Visit | Attending: Radiation Oncology | Admitting: Radiation Oncology

## 2024-06-13 DIAGNOSIS — E118 Type 2 diabetes mellitus with unspecified complications: Secondary | ICD-10-CM

## 2024-06-13 DIAGNOSIS — Z510 Encounter for antineoplastic radiation therapy: Secondary | ICD-10-CM | POA: Diagnosis not present

## 2024-06-13 DIAGNOSIS — E785 Hyperlipidemia, unspecified: Secondary | ICD-10-CM | POA: Diagnosis not present

## 2024-06-13 DIAGNOSIS — C61 Malignant neoplasm of prostate: Secondary | ICD-10-CM | POA: Diagnosis not present

## 2024-06-13 LAB — CBC WITH DIFFERENTIAL/PLATELET
Basophils Absolute: 0 K/uL (ref 0.0–0.1)
Basophils Relative: 0.4 % (ref 0.0–3.0)
Eosinophils Absolute: 0.1 K/uL (ref 0.0–0.7)
Eosinophils Relative: 2.5 % (ref 0.0–5.0)
HCT: 37.9 % — ABNORMAL LOW (ref 39.0–52.0)
Hemoglobin: 13.2 g/dL (ref 13.0–17.0)
Lymphocytes Relative: 12.5 % (ref 12.0–46.0)
Lymphs Abs: 0.5 K/uL — ABNORMAL LOW (ref 0.7–4.0)
MCHC: 34.8 g/dL (ref 30.0–36.0)
MCV: 97.1 fl (ref 78.0–100.0)
Monocytes Absolute: 0.5 K/uL (ref 0.1–1.0)
Monocytes Relative: 11.9 % (ref 3.0–12.0)
Neutro Abs: 3.1 K/uL (ref 1.4–7.7)
Neutrophils Relative %: 72.7 % (ref 43.0–77.0)
Platelets: 137 K/uL — ABNORMAL LOW (ref 150.0–400.0)
RBC: 3.9 Mil/uL — ABNORMAL LOW (ref 4.22–5.81)
RDW: 14.5 % (ref 11.5–15.5)
WBC: 4.2 K/uL (ref 4.0–10.5)

## 2024-06-13 LAB — RAD ONC ARIA SESSION SUMMARY
Course Elapsed Days: 51
Plan Fractions Treated to Date: 12
Plan Prescribed Dose Per Fraction: 1.8 Gy
Plan Total Fractions Prescribed: 13
Plan Total Prescribed Dose: 23.4 Gy
Reference Point Dosage Given to Date: 21.6 Gy
Reference Point Session Dosage Given: 1.8 Gy
Session Number: 37

## 2024-06-13 LAB — COMPREHENSIVE METABOLIC PANEL WITH GFR
ALT: 15 U/L (ref 0–53)
AST: 17 U/L (ref 0–37)
Albumin: 3.9 g/dL (ref 3.5–5.2)
Alkaline Phosphatase: 54 U/L (ref 39–117)
BUN: 13 mg/dL (ref 6–23)
CO2: 29 meq/L (ref 19–32)
Calcium: 8.8 mg/dL (ref 8.4–10.5)
Chloride: 105 meq/L (ref 96–112)
Creatinine, Ser: 1.11 mg/dL (ref 0.40–1.50)
GFR: 63.56 mL/min (ref >60.00)
Glucose, Bld: 101 mg/dL — ABNORMAL HIGH (ref 70–99)
Potassium: 3.8 meq/L (ref 3.5–5.1)
Sodium: 141 meq/L (ref 135–145)
Total Bilirubin: 2.7 mg/dL — ABNORMAL HIGH (ref 0.2–1.2)
Total Protein: 6 g/dL (ref 6.0–8.3)

## 2024-06-13 LAB — LIPID PANEL
Cholesterol: 113 mg/dL (ref 0–200)
HDL: 43.2 mg/dL (ref >39.00)
LDL Cholesterol: 54 mg/dL (ref 0–99)
NonHDL: 70.28
Total CHOL/HDL Ratio: 3
Triglycerides: 79 mg/dL (ref 0.0–149.0)
VLDL: 15.8 mg/dL (ref 0.0–40.0)

## 2024-06-13 LAB — HEMOGLOBIN A1C: Hgb A1c MFr Bld: 6.4 % (ref 4.6–6.5)

## 2024-06-13 LAB — PSA: PSA: 0.71 ng/mL (ref 0.10–4.00)

## 2024-06-13 LAB — MICROALBUMIN / CREATININE URINE RATIO
Creatinine,U: 33.7 mg/dL
Microalb Creat Ratio: UNDETERMINED mg/g (ref 0.0–30.0)
Microalb, Ur: <0.7 mg/dL

## 2024-06-13 LAB — TSH: TSH: 2.27 u[IU]/mL (ref 0.35–5.50)

## 2024-06-14 ENCOUNTER — Ambulatory Visit
Admission: RE | Admit: 2024-06-14 | Discharge: 2024-06-14 | Disposition: A | Discharge status: Home / Self Care | Source: Ambulatory Visit | Attending: Radiation Oncology

## 2024-06-14 ENCOUNTER — Ambulatory Visit
Admission: RE | Admit: 2024-06-14 | Discharge: 2024-06-14 | Disposition: A | Discharge status: Home / Self Care | Source: Ambulatory Visit | Attending: Radiation Oncology | Admitting: Radiation Oncology

## 2024-06-14 ENCOUNTER — Ambulatory Visit: Payer: Self-pay | Admitting: Internal Medicine

## 2024-06-14 ENCOUNTER — Other Ambulatory Visit: Payer: Self-pay

## 2024-06-14 DIAGNOSIS — C61 Malignant neoplasm of prostate: Secondary | ICD-10-CM

## 2024-06-14 DIAGNOSIS — Z510 Encounter for antineoplastic radiation therapy: Secondary | ICD-10-CM | POA: Diagnosis not present

## 2024-06-14 LAB — RAD ONC ARIA SESSION SUMMARY
Course Elapsed Days: 52
Plan Fractions Treated to Date: 13
Plan Prescribed Dose Per Fraction: 1.8 Gy
Plan Total Fractions Prescribed: 13
Plan Total Prescribed Dose: 23.4 Gy
Reference Point Dosage Given to Date: 23.4 Gy
Reference Point Session Dosage Given: 1.8 Gy
Session Number: 38

## 2024-06-15 NOTE — Progress Notes (Signed)
 Patient was a RadOnc Consult on 04/09/24 for his rising PSA of 0.4 s/p RALP.  Patient proceed with treatment recommendations of 7.5 weeks of salvage external beam therapy  and had his final radiation treatment on 06/14/24.   Patient is scheduled for a post treatment nurse call on 07/17/24 and has his first post treatment PSA on 10/28 at [facility].

## 2024-06-15 NOTE — Radiation Completion Notes (Addendum)
  Radiation Oncology         [phone] ________________________________  Name: Shawn Orozco MRN: [id number]  Date: 06/14/2024  DOB: 1946-02-01  Referring Physician: DONNICE SIAD, M.D. Date of Service: 2024-06-15 Radiation Oncologist: Adina Barge, M.D. [facility]     RADIATION ONCOLOGY END OF TREATMENT NOTE     Diagnosis: 78 y.o. gentleman with a rising PSA of 0.4 s/p RALP 03/2020 for Stage [id number] prostate cancer   Intent: Curative     ==========DELIVERED PLANS==========  First Treatment Date: 2024-04-23 Last Treatment Date: 2024-06-14   Plan Name: ProstBed_Pelv Site: Prostate Bed Technique: IMRT Mode: Photon Dose Per Fraction: 1.8 Gy Prescribed Dose (Delivered / Prescribed): 45 Gy / 45 Gy Prescribed Fxs (Delivered / Prescribed): 25 / 25   Plan Name: ProstBed_Bst Site: Prostate Bed Technique: IMRT Mode: Photon Dose Per Fraction: 1.8 Gy Prescribed Dose (Delivered / Prescribed): 23.4 Gy / 23.4 Gy Prescribed Fxs (Delivered / Prescribed): 13 / 13     ==========ON TREATMENT VISIT DATES========== 2024-04-27, 2024-05-04, 2024-05-11, 2024-05-18, 2024-05-24, 2024-06-01, 2024-06-08, 2024-06-14    See weekly On Treatment Notes in Epic for details in the Media tab (listed as Progress notes on the On Treatment Visit Dates listed above).  He tolerated the daily radiation treatments relatively well with only modest fatigue.  The patient will receive a call in about one month from the radiation oncology department. He will continue follow up with his urologist, Dr. SIAD, as well.  ------------------------------------------------   DONNICE Barge, MD [facility]  Radiation Oncology Direct Dial: [phone]  Fax: [phone] [email or web address]  Skype  LinkedIn

## 2024-06-20 ENCOUNTER — Encounter: Payer: Self-pay | Admitting: Internal Medicine

## 2024-06-20 ENCOUNTER — Ambulatory Visit (INDEPENDENT_AMBULATORY_CARE_PROVIDER_SITE_OTHER): Admitting: Internal Medicine

## 2024-06-20 VITALS — BP 118/60 | HR 66 | Temp 98.0°F | Ht 72.0 in | Wt 169.4 lb

## 2024-06-20 DIAGNOSIS — N1831 Chronic kidney disease, stage 3a: Secondary | ICD-10-CM

## 2024-06-20 DIAGNOSIS — I2583 Coronary atherosclerosis due to lipid rich plaque: Secondary | ICD-10-CM | POA: Diagnosis not present

## 2024-06-20 DIAGNOSIS — K5900 Constipation, unspecified: Secondary | ICD-10-CM | POA: Insufficient documentation

## 2024-06-20 DIAGNOSIS — G4700 Insomnia, unspecified: Secondary | ICD-10-CM | POA: Insufficient documentation

## 2024-06-20 DIAGNOSIS — E118 Type 2 diabetes mellitus with unspecified complications: Secondary | ICD-10-CM | POA: Diagnosis not present

## 2024-06-20 DIAGNOSIS — C61 Malignant neoplasm of prostate: Secondary | ICD-10-CM

## 2024-06-20 MED ORDER — ZOLPIDEM TARTRATE 10 MG PO TABS: 5.0000 mg | ORAL_TABLET | Freq: Every evening | ORAL | 3 refills | Status: DC | PRN

## 2024-06-20 NOTE — Assessment & Plan Note (Addendum)
 Pt finished XRT in 05/2024 (Dr Beverley) F/u w/Dr Selma

## 2024-06-20 NOTE — Assessment & Plan Note (Signed)
 Calcium  seen in the proximal and mid LAD proximal L circumflex. On ASA, Crestor  F/u w/Dr Santo

## 2024-06-20 NOTE — Assessment & Plan Note (Addendum)
 Pt finished XRT in 05/2024 (Dr Beverley) Colace. Add Miralax Try Kefir

## 2024-06-20 NOTE — Progress Notes (Signed)
 Subjective:  Patient ID: Shawn Orozco, male    DOB: 1945-12-18  Age: 79 y.o. MRN: [id number]  CC: Medical Management of Chronic Issues (Here for 3 month f/u, Diabetes, Dyslipidemia, CAD)   HPI Faiz Weber presents for DM, CAD, dyslipidemia, prostate cancer - finished XRT  Outpatient Medications Prior to Visit  Medication Sig Dispense Refill   aspirin  EC 81 MG tablet Take 81 mg by mouth daily. Swallow whole.     cholecalciferol (VITAMIN D3) 25 MCG (1000 UNIT) tablet Take 1,000 Units by mouth daily.     FLONASE SENSIMIST 27.5 MCG/SPRAY nasal spray 2 sprays 2 (two) times daily.     fluorouracil (EFUDEX) 5 % cream Apply topically 2 (two) times daily as needed.     Hypertonic Nasal Wash (SINUS RINSE) PACK Place into the nose as needed.     loratadine  (CLARITIN ) 10 MG tablet Take 1 tablet (10 mg total) by mouth daily. 30 tablet 11   Niacinamide 500 MG TBCR Take 500 mg by mouth daily.     omeprazole  (PRILOSEC) 20 MG capsule Take 1 capsule (20 mg total) by mouth daily. 90 capsule 1   rosuvastatin  (CRESTOR ) 20 MG tablet Take 1 tablet (20 mg total) by mouth daily. KEEP OV. 90 tablet 3   No facility-administered medications prior to visit.    ROS: Review of Systems  Constitutional:  Positive for fatigue and unexpected weight change. Negative for appetite change.  HENT:  Negative for congestion, nosebleeds, sneezing, sore throat and trouble swallowing.   Eyes:  Negative for itching and visual disturbance.  Respiratory:  Negative for cough.   Cardiovascular:  Negative for chest pain, palpitations and leg swelling.  Gastrointestinal:  Negative for abdominal distention, blood in stool, diarrhea and nausea.  Genitourinary:  Negative for frequency and hematuria.  Musculoskeletal:  Negative for back pain, gait problem, joint swelling and neck pain.  Skin:  Negative for rash.  Neurological:  Negative for dizziness, tremors, speech difficulty and weakness.  Psychiatric/Behavioral:  Positive for  sleep disturbance. Negative for agitation, dysphoric mood and suicidal ideas. The patient is nervous/anxious.     Objective:  BP 118/60 (BP Location: Left Arm, Patient Position: Sitting, Cuff Size: Normal)   Pulse 66   Temp 98 F (36.7 C) (Oral)   Ht 6' (1.829 m)   Wt 169 lb 6.1 oz (76.8 kg)   SpO2 98%   BMI 22.97 kg/m   BP Readings from Last 3 Encounters:  06/20/24 118/60  04/12/24 130/75  04/09/24 (!) 145/97    Wt Readings from Last 3 Encounters:  06/20/24 169 lb 6.1 oz (76.8 kg)  04/12/24 180 lb (81.6 kg)  04/09/24 173 lb 6.4 oz (78.7 kg)    Physical Exam Constitutional:      General: He is not in acute distress.    Appearance: Normal appearance. He is well-developed.     Comments: NAD  Eyes:     Conjunctiva/sclera: Conjunctivae normal.     Pupils: Pupils are equal, round, and reactive to light.  Neck:     Thyroid : No thyromegaly.     Vascular: No JVD.  Cardiovascular:     Rate and Rhythm: Normal rate and regular rhythm.     Heart sounds: Normal heart sounds. No murmur heard.    No friction rub. No gallop.  Pulmonary:     Effort: Pulmonary effort is normal. No respiratory distress.     Breath sounds: Normal breath sounds. No wheezing or rales.  Chest:  Chest wall: No tenderness.  Abdominal:     General: Bowel sounds are normal. There is no distension.     Palpations: Abdomen is soft. There is no mass.     Tenderness: There is no abdominal tenderness. There is no guarding or rebound.  Musculoskeletal:        General: No tenderness. Normal range of motion.     Cervical back: Normal range of motion.     Right lower leg: No edema.     Left lower leg: No edema.  Lymphadenopathy:     Cervical: No cervical adenopathy.  Skin:    General: Skin is warm and dry.     Findings: No rash.  Neurological:     Mental Status: He is alert and oriented to person, place, and time.     Cranial Nerves: No cranial nerve deficit.     Motor: No abnormal muscle tone.      Coordination: Coordination normal.     Gait: Gait normal.     Deep Tendon Reflexes: Reflexes are normal and symmetric.  Psychiatric:        Behavior: Behavior normal.        Thought Content: Thought content normal.        Judgment: Judgment normal.   Virtual Visit via Video Note  I connected with Shawn Orozco on 06/20/24 at  1:40 PM EDT by a video enabled telemedicine application and verified that I am speaking with the correct person using two identifiers.   I discussed the limitations of evaluation and management by telemedicine and the availability of in person appointments. The patient expressed understanding and agreed to proceed.  I was located at our [facility] [facility] office. The patient was at home. There was no one else present in the visit.  Chief Complaint  Patient presents with   Medical Management of Chronic Issues    Here for 3 month f/u, Diabetes, Dyslipidemia, CAD     History of Present Illness:   Review of Systems  Constitutional:  Positive for fatigue and unexpected weight change. Negative for appetite change.  HENT:  Negative for congestion, nosebleeds, sneezing, sore throat and trouble swallowing.   Eyes:  Negative for itching and visual disturbance.  Respiratory:  Negative for cough.   Cardiovascular:  Negative for chest pain, palpitations and leg swelling.  Gastrointestinal:  Negative for abdominal distention, blood in stool, diarrhea and nausea.  Genitourinary:  Negative for frequency and hematuria.  Musculoskeletal:  Negative for back pain, gait problem, joint swelling and neck pain.  Skin:  Negative for rash.  Neurological:  Negative for dizziness, tremors, speech difficulty and weakness.  Psychiatric/Behavioral:  Positive for sleep disturbance. Negative for agitation, dysphoric mood and suicidal ideas. The patient is nervous/anxious.      Observations/Objective: The patient appears to be in no acute distress  Assessment and Plan:  Problem List Items  Addressed This Visit     Chronic renal impairment, stage 3a (HCC)   Hydrate well      Relevant Orders   CBC with Differential/Platelet   Comprehensive metabolic panel with GFR   TSH   Hemoglobin A1c   Urinalysis   Constipation - Primary   Pt finished XRT in 05/2024 (Dr Beverley) Colace. Add Miralax Try Kefir       Relevant Orders   CBC with Differential/Platelet   Comprehensive metabolic panel with GFR   TSH   Hemoglobin A1c   Urinalysis   Coronary atherosclerosis   Calcium  seen in the proximal  and mid LAD proximal L circumflex. On ASA, Crestor  F/u w/Dr Santo      Relevant Orders   CBC with Differential/Platelet   Comprehensive metabolic panel with GFR   TSH   Hemoglobin A1c   Urinalysis   Diabetes type 2, controlled (HCC)    Pt lost wt - Pt finished XRT in 05/2024 (Dr Beverley) - lost 10 more lbs Colace. Add Miralax Monitor labs Not on Metformin      Relevant Orders   Hemoglobin A1c   Insomnia   Try Zolpidem  prn Potential benefits of a short/long term benzodiazepines  use as well as potential risks  and complications were explained to the patient and were aknowledged.        Prostate cancer (HCC)   Pt finished XRT in 05/2024 (Dr Beverley) F/u w/Dr Selma        Meds ordered this encounter  Medications   zolpidem  (AMBIEN ) 10 MG tablet    Sig: Take 0.5-1 tablets (5-10 mg total) by mouth at bedtime as needed for sleep.    Dispense:  30 tablet    Refill:  3     Follow Up Instructions:    I discussed the assessment and treatment plan with the patient. The patient was provided an opportunity to ask questions and all were answered. The patient agreed with the plan and demonstrated an understanding of the instructions.   The patient was advised to call back or seek an in-person evaluation if the symptoms worsen or if the condition fails to improve as anticipated.  I provided face-to-face time during this encounter. We were at different  locations.   Marolyn Noel, MD   Lab Results  Component Value Date   WBC 4.2 06/13/2024   HGB 13.2 06/13/2024   HCT 37.9 (L) 06/13/2024   PLT 137.0 (L) 06/13/2024   GLUCOSE 101 (H) 06/13/2024   CHOL 113 06/13/2024   TRIG 79.0 06/13/2024   HDL 43.20 06/13/2024   LDLCALC 54 06/13/2024   ALT 15 06/13/2024   AST 17 06/13/2024   NA 141 06/13/2024   K 3.8 06/13/2024   CL 105 06/13/2024   CREATININE 1.11 06/13/2024   BUN 13 06/13/2024   CO2 29 06/13/2024   TSH 2.27 06/13/2024   PSA 0.71 06/13/2024   HGBA1C 6.4 06/13/2024   MICROALBUR <0.7 06/13/2024    No results found.  Assessment & Plan:   Problem List Items Addressed This Visit     Chronic renal impairment, stage 3a (HCC)   Hydrate well      Relevant Orders   CBC with Differential/Platelet   Comprehensive metabolic panel with GFR   TSH   Hemoglobin A1c   Urinalysis   Constipation - Primary   Pt finished XRT in 05/2024 (Dr Beverley) Colace. Add Miralax Try Kefir       Relevant Orders   CBC with Differential/Platelet   Comprehensive metabolic panel with GFR   TSH   Hemoglobin A1c   Urinalysis   Coronary atherosclerosis   Calcium  seen in the proximal and mid LAD proximal L circumflex. On ASA, Crestor  F/u w/Dr Santo      Relevant Orders   CBC with Differential/Platelet   Comprehensive metabolic panel with GFR   TSH   Hemoglobin A1c   Urinalysis   Diabetes type 2, controlled (HCC)    Pt lost wt - Pt finished XRT in 05/2024 (Dr Beverley) - lost 10 more lbs Colace. Add Miralax Monitor labs Not on Metformin  Relevant Orders   Hemoglobin A1c   Insomnia   Try Zolpidem  prn Potential benefits of a short/long term benzodiazepines  use as well as potential risks  and complications were explained to the patient and were aknowledged.        Prostate cancer (HCC)   Pt finished XRT in 05/2024 (Dr Beverley) F/u w/Dr Selma         Meds ordered this encounter  Medications   zolpidem  (AMBIEN )  10 MG tablet    Sig: Take 0.5-1 tablets (5-10 mg total) by mouth at bedtime as needed for sleep.    Dispense:  30 tablet    Refill:  3      Follow-up: Return in about 3 months (around 09/20/2024) for a follow-up visit.  Marolyn Noel, MD

## 2024-06-20 NOTE — Assessment & Plan Note (Signed)
 Try Zolpidem  prn Potential benefits of a short/long term benzodiazepines  use as well as potential risks  and complications were explained to the patient and were aknowledged.

## 2024-06-20 NOTE — Patient Instructions (Addendum)
 Try Kefir Miralax B complex

## 2024-06-20 NOTE — Assessment & Plan Note (Addendum)
  Pt lost wt - Pt finished XRT in 05/2024 (Dr Beverley) - lost 10 more lbs Colace. Add Miralax Monitor labs Not on Metformin

## 2024-06-20 NOTE — Assessment & Plan Note (Signed)
 Hydrate well

## 2024-06-28 ENCOUNTER — Encounter: Payer: Self-pay | Admitting: Internal Medicine

## 2024-06-28 ENCOUNTER — Ambulatory Visit: Admitting: Internal Medicine

## 2024-06-28 VITALS — BP 110/60 | HR 64 | Temp 97.8°F | Ht 72.0 in | Wt 168.0 lb

## 2024-06-28 DIAGNOSIS — C61 Malignant neoplasm of prostate: Secondary | ICD-10-CM

## 2024-06-28 DIAGNOSIS — K5900 Constipation, unspecified: Secondary | ICD-10-CM

## 2024-06-28 DIAGNOSIS — E118 Type 2 diabetes mellitus with unspecified complications: Secondary | ICD-10-CM

## 2024-06-28 DIAGNOSIS — R1033 Periumbilical pain: Secondary | ICD-10-CM | POA: Diagnosis not present

## 2024-06-28 MED ORDER — HYOSCYAMINE SULFATE 0.125 MG PO TABS: 0.1250 mg | ORAL_TABLET | ORAL | 3 refills | Status: DC | PRN

## 2024-06-28 MED ORDER — TRAMADOL HCL 50 MG PO TABS: 50.0000 mg | ORAL_TABLET | Freq: Four times a day (QID) | ORAL | 1 refills | Status: DC | PRN

## 2024-06-28 NOTE — Patient Instructions (Signed)
 USEFUL THINGS FOR ARTHRITIS and musculoskeletal pains:    A "rice sock heating pad" refers to a homemade heating pad created by filling a sock with uncooked rice, which can be heated in a microwave to provide a warm compress for sore muscles, pain relief, or other applications; essentially, it's a simple way to generate heat using readily available materials.  Key points about rice sock heat: How to make it: Fill a clean sock (preferably a tube sock) about 2/3 full with uncooked rice, tie a knot at the top to secure the rice inside.  Heating it up: Place the rice sock in the microwave and heat in short intervals (usually around 30 seconds at a time) until it reaches the desired warmth.  Important considerations: Check temperature before applying: Always test the temperature of the rice sock before applying it to your skin to avoid burns.  Use a towel to protect skin: Wrap the rice sock in a thin towel to distribute the heat evenly and protect your skin.  Uses: Muscle aches and pains  Menstrual cramps  Neck pain  Arthritis discomfort

## 2024-06-28 NOTE — Progress Notes (Unsigned)
   Subjective:  Patient ID: Shawn Orozco, male    DOB: 29-Jul-1946  Age: 78 y.o. MRN: [id number]  CC: Constipation (Pt states he has been having abdominal pain x4 days a/w bloating and feels as if it is a deep pain. Pt states he has recently finished radiation and was informed it may cause some intestinal issues and blockage)   HPI Dimitrios Balestrieri presents for intermittent periumbilical pain 6-7/10 at times. Grabbing pain. It comes and goes...  Outpatient Medications Prior to Visit  Medication Sig Dispense Refill  . aspirin  EC 81 MG tablet Take 81 mg by mouth daily. Swallow whole.    . cholecalciferol (VITAMIN D3) 25 MCG (1000 UNIT) tablet Take 1,000 Units by mouth daily.    SABRA FLONASE SENSIMIST 27.5 MCG/SPRAY nasal spray 2 sprays 2 (two) times daily.    . fluorouracil (EFUDEX) 5 % cream Apply topically 2 (two) times daily as needed.    . Hypertonic Nasal Wash (SINUS RINSE) PACK Place into the nose as needed.    . loratadine  (CLARITIN ) 10 MG tablet Take 1 tablet (10 mg total) by mouth daily. 30 tablet 11  . Niacinamide 500 MG TBCR Take 500 mg by mouth daily.    . omeprazole  (PRILOSEC) 20 MG capsule Take 1 capsule (20 mg total) by mouth daily. 90 capsule 1  . rosuvastatin  (CRESTOR ) 20 MG tablet Take 1 tablet (20 mg total) by mouth daily. KEEP OV. 90 tablet 3  . zolpidem  (AMBIEN ) 10 MG tablet Take 0.5-1 tablets (5-10 mg total) by mouth at bedtime as needed for sleep. 30 tablet 3   No facility-administered medications prior to visit.    ROS: Review of Systems  Objective:  BP 110/60   Pulse 64   Temp 97.8 F (36.6 C) (Oral)   Ht 6' (1.829 m)   Wt 168 lb (76.2 kg)   SpO2 99%   BMI 22.78 kg/m   BP Readings from Last 3 Encounters:  06/28/24 110/60  06/20/24 118/60  04/12/24 130/75    Wt Readings from Last 3 Encounters:  06/28/24 168 lb (76.2 kg)  06/20/24 169 lb 6.1 oz (76.8 kg)  04/12/24 180 lb (81.6 kg)    Physical Exam  Lab Results  Component Value Date   WBC 4.2  06/13/2024   HGB 13.2 06/13/2024   HCT 37.9 (L) 06/13/2024   PLT 137.0 (L) 06/13/2024   GLUCOSE 101 (H) 06/13/2024   CHOL 113 06/13/2024   TRIG 79.0 06/13/2024   HDL 43.20 06/13/2024   LDLCALC 54 06/13/2024   ALT 15 06/13/2024   AST 17 06/13/2024   NA 141 06/13/2024   K 3.8 06/13/2024   CL 105 06/13/2024   CREATININE 1.11 06/13/2024   BUN 13 06/13/2024   CO2 29 06/13/2024   TSH 2.27 06/13/2024   PSA 0.71 06/13/2024   HGBA1C 6.4 06/13/2024   MICROALBUR <0.7 06/13/2024    No results found.  Assessment & Plan:   Problem List Items Addressed This Visit   None     No orders of the defined types were placed in this encounter.     Follow-up: No follow-ups on file.  Marolyn Noel, MD

## 2024-06-29 ENCOUNTER — Telehealth: Payer: Self-pay | Admitting: Internal Medicine

## 2024-06-29 DIAGNOSIS — R109 Unspecified abdominal pain: Secondary | ICD-10-CM | POA: Insufficient documentation

## 2024-06-29 NOTE — Assessment & Plan Note (Signed)
 Continue with diet Pt lost wt - Pt finished XRT in 05/2024 (Dr Beverley) - lost 10 more lbs

## 2024-06-29 NOTE — Telephone Encounter (Signed)
 Shawn Orozco, I called the patient earlier today to check on him and left a voicemail. I was wondering if we need to pursue abdominal CT scan today or if he is better. Can you call him and find out, please? Thank you

## 2024-06-29 NOTE — Assessment & Plan Note (Signed)
 Status post recent radiation therapy.

## 2024-06-29 NOTE — Assessment & Plan Note (Signed)
 Better Continue with Colace, Miralax Monitor labs

## 2024-06-29 NOTE — Assessment & Plan Note (Signed)
 New intermittent periumbilical pain 6-7/10 at times.   The pain is absent for periods of time.  Grabbing pain is described. It comes and goes... Has been going on for 2-3 weeks.  It developed by the end of his radiation treatments.  There is no chills, fever.  His appetite is normal.  No arthralgias.  He had a normal stool.  There is no pain radiation.  The pain is worse with rest and better with activity.  It is not better or worse with eating, BMs etc.  There is no urinary symptoms...  Etiology is unclear.  Differential diagnosis is broad.  Most likely the pain is related to radiation therapy.  No signs of hernia.  No signs of appendicitis, peritonitis, however, actually needs to be considered.  The patient had an PET/CT in May and abdominal CT in 2024.  It is unlikely that we are missing a malignancy.  We reviewed his recent blood work.  We discussed our options.  The best test would be another abdominal CT scan with contrast.  It will be another exposure to radiation/contrast. Abdominal ultrasound is safer but less informative. I prescribed Levsin  and tramadol  to use with caution.  Go to ER if worse. Will reassess in a day or 2.

## 2024-07-03 NOTE — Telephone Encounter (Signed)
 Pt states he is feeling better and no need to order the CT Scan at this time.

## 2024-07-04 NOTE — Telephone Encounter (Signed)
 Good. Thanks!

## 2024-07-10 ENCOUNTER — Other Ambulatory Visit: Payer: Self-pay

## 2024-07-10 ENCOUNTER — Telehealth: Payer: Self-pay | Admitting: Radiation Oncology

## 2024-07-10 ENCOUNTER — Other Ambulatory Visit: Payer: Self-pay | Admitting: Urology

## 2024-07-10 DIAGNOSIS — C61 Malignant neoplasm of prostate: Secondary | ICD-10-CM

## 2024-07-10 MED ORDER — OMEPRAZOLE 20 MG PO CPDR: 20.0000 mg | DELAYED_RELEASE_CAPSULE | Freq: Every day | ORAL | 1 refills | Status: AC

## 2024-07-10 NOTE — Addendum Note (Signed)
 Encounter addended by: Sherwood Rise, PA-C on: 07/10/2024 12:26 PM  Actions taken: Clinical Note Signed, Visit diagnoses modified, SmartForm saved

## 2024-07-10 NOTE — Telephone Encounter (Signed)
 Sent inbasket to [employer] regarding referral from Dr. Bedelia Rusk for survivorship appt.

## 2024-07-10 NOTE — Telephone Encounter (Signed)
 Note created in error.

## 2024-07-16 NOTE — Progress Notes (Signed)
  Radiation Oncology         [phone] ________________________________  Name: Shawn Orozco MRN: [id number]  Date of Service: 07/17/2024  DOB: 11-10-1946  Post Treatment Telephone Note  Diagnosis:  Rising PSA of 0.4 s/p RALP 03/2020 for Stage [id number] prostate cancer   Pre Treatment IPSS Score:  1  The patient was available for call today.   Symptoms of fatigue have improved since completing therapy.  Symptoms of bladder changes have improved since completing therapy. Current symptoms include Nocturia x1-2, and no medications needed for bladder.  Symptoms of bowel changes have improved since completing therapy. Reports takes laxative as needed but as of now no serious issues.  Current symptoms include mild constipation and medications for bowel symptoms include Miralax.   Post Treatment IPSS Score: IPSS Questionnaire (AUA-7): Over the past month.   1)  How often have you had a sensation of not emptying your bladder completely after you finish urinating?  0 - Not at all  2)  How often have you had to urinate again less than two hours after you finished urinating? 0 - Not at all  3)  How often have you found you stopped and started again several times when you urinated?  0 - Not at all  4) How difficult have you found it to postpone urination?  0 - Not at all  5) How often have you had a weak urinary stream?  0 - Not at all  6) How often have you had to push or strain to begin urination?  0 - Not at all  7) How many times did you most typically get up to urinate from the time you went to bed until the time you got up in the morning?  1 - 1 time  Total score:  1. Which indicates mild symptoms  0-7 mildly symptomatic   8-19 moderately symptomatic   20-35 severely symptomatic   Patient (has a scheduled follow up visit with his urologist, Dr. Donnice Siad, on 09/18/2024 for PSA lab.  He was counseled that PSA levels will be drawn in the urology office, and was reassured that additional  time is expected to improve bowel and bladder symptoms. He was encouraged to call back with concerns or questions regarding radiation.

## 2024-07-17 ENCOUNTER — Ambulatory Visit
Admission: RE | Admit: 2024-07-17 | Discharge: 2024-07-17 | Disposition: A | Discharge status: Home / Self Care | Source: Ambulatory Visit | Attending: Radiation Oncology | Admitting: Radiation Oncology

## 2024-07-17 DIAGNOSIS — C61 Malignant neoplasm of prostate: Secondary | ICD-10-CM | POA: Insufficient documentation

## 2024-07-31 ENCOUNTER — Encounter: Payer: Self-pay | Admitting: *Deleted

## 2024-08-09 ENCOUNTER — Ambulatory Visit: Payer: Medicare Other

## 2024-08-10 ENCOUNTER — Ambulatory Visit

## 2024-08-10 VITALS — Ht 72.0 in | Wt 168.0 lb

## 2024-08-10 DIAGNOSIS — Z0000 Encounter for general adult medical examination without abnormal findings: Secondary | ICD-10-CM

## 2024-08-10 NOTE — Progress Notes (Cosign Needed Addendum)
 Subjective:   Shawn Orozco is a 78 y.o. who presents for a Medicare Wellness preventive visit.  As a reminder, Annual Wellness Visits don't include a physical exam, and some assessments may be limited, especially if this visit is performed virtually. We may recommend an in-person follow-up visit with your provider if needed.  Visit Complete: Virtual I connected with  Shawn Orozco on 08/10/24 by a audio enabled telemedicine application and verified that I am speaking with the correct person using two identifiers.  Patient Location: Home  Provider Location: Office/Clinic  I discussed the limitations of evaluation and management by telemedicine. The patient expressed understanding and agreed to proceed.  Vital Signs: Because this visit was a virtual/telehealth visit, some criteria may be missing or patient reported. Any vitals not documented were not able to be obtained and vitals that have been documented are patient reported.  VideoDeclined- This patient declined Librarian, academic. Therefore the visit was completed with audio only.  Persons Participating in Visit: Patient.  AWV Questionnaire: Yes: Patient Medicare AWV questionnaire was completed by the patient on 08/06/2024; I have confirmed that all information answered by patient is correct and no changes since this date.  Cardiac Risk Factors include: advanced age (>69men, >45 women);male gender;diabetes mellitus;Other (see comment);dyslipidemia, Risk factor comments: hx of prostate cancer, coronary atherosclerosis     Objective:    Today's Vitals   08/10/24 0812  Weight: 168 lb (76.2 kg)  Height: 6' (1.829 m)   Body mass index is 22.78 kg/m.     08/10/2024    8:17 AM 04/09/2024   10:17 AM 08/09/2023    8:53 AM 08/04/2022   10:50 AM 03/22/2022    7:11 AM 08/03/2021    1:43 PM  Advanced Directives  Does Patient Have a Medical Advance Directive? Yes Yes Yes Yes Yes Yes  Type of Special educational needs teacher of [place];Living will Healthcare Power of [place];Living will Healthcare Power of [place];Living will Healthcare Power of [place];Living will Healthcare Power of [place];Living will Living will;Healthcare Power of Attorney  Does patient want to make changes to medical advance directive?      No - Patient declined  Copy of Healthcare Power of Attorney in Chart? No - copy requested  No - copy requested No - copy requested  No - copy requested    Current Medications (verified) Outpatient Encounter Medications as of 08/10/2024  Medication Sig   aspirin  EC 81 MG tablet Take 81 mg by mouth daily. Swallow whole.   cholecalciferol (VITAMIN D3) 25 MCG (1000 UNIT) tablet Take 1,000 Units by mouth daily.   FLONASE SENSIMIST 27.5 MCG/SPRAY nasal spray 2 sprays 2 (two) times daily.   fluorouracil (EFUDEX) 5 % cream Apply topically 2 (two) times daily as needed.   hyoscyamine  (LEVSIN ) 0.125 MG tablet Take 1-2 tablets (0.125-0.25 mg total) by mouth every 4 (four) hours as needed for up to 10 days for cramping.   Hypertonic Nasal Wash (SINUS RINSE) PACK Place into the nose as needed.   loratadine  (CLARITIN ) 10 MG tablet Take 1 tablet (10 mg total) by mouth daily.   Niacinamide 500 MG TBCR Take 500 mg by mouth daily.   omeprazole  (PRILOSEC) 20 MG capsule Take 1 capsule (20 mg total) by mouth daily.   rosuvastatin  (CRESTOR ) 20 MG tablet Take 1 tablet (20 mg total) by mouth daily. KEEP OV.   zolpidem  (AMBIEN ) 10 MG tablet Take 0.5-1 tablets (5-10 mg total) by mouth at bedtime as needed for sleep.  traMADol  (ULTRAM ) 50 MG tablet Take 1 tablet (50 mg total) by mouth every 6 (six) hours as needed. (Patient not taking: Reported on 08/10/2024)   No facility-administered encounter medications on file as of 08/10/2024.    Allergies (verified) Prednisone   History: Past Medical History:  Diagnosis Date   Arthritis    Cataract    removed bilat 2014 with lens implants    Colon polyps     Deviated septum    GERD (gastroesophageal reflux disease)    Hyperlipidemia    preventative for FH    Prostate cancer (HCC) 2021   04-14-2020 prostate surgery    Past Surgical History:  Procedure Laterality Date   CATARACT EXTRACTION, BILATERAL     COLONOSCOPY     KNEE ARTHROSCOPY Right    NASAL SEPTUM SURGERY     POLYPECTOMY     PROSTATECTOMY  04/14/2020   SHOULDER ARTHROSCOPY Right    TONSILLECTOMY     Family History  Problem Relation Age of Onset   Alzheimer's disease Mother    Alzheimer's disease Brother    Heart disease Brother    Heart disease Brother    Colon cancer Neg Hx    Colon polyps Neg Hx    Esophageal cancer Neg Hx    Rectal cancer Neg Hx    Stomach cancer Neg Hx    Social History   Socioeconomic History   Marital status: Divorced    Spouse name: Not on file   Number of children: 0   Years of education: Not on file   Highest education level: Some college, no degree  Occupational History   Occupation: retired  Tobacco Use   Smoking status: Never   Smokeless tobacco: Never  Vaping Use   Vaping status: Never Used  Substance and Sexual Activity   Alcohol use: Never   Drug use: Never   Sexual activity: Not Currently  Other Topics Concern   Not on file  Social History Narrative   Lives alone/2025   Social Drivers of Health   Financial Resource Strain: Low Risk  (08/10/2024)   Overall Financial Resource Strain (CARDIA)    Difficulty of Paying Living Expenses: Not hard at all  Food Insecurity: No Food Insecurity (08/10/2024)   Hunger Vital Sign    Worried About Running Out of Food in the Last Year: Never true    Ran Out of Food in the Last Year: Never true  Transportation Needs: No Transportation Needs (08/10/2024)   PRAPARE - Administrator, Civil Service (Medical): No    Lack of Transportation (Non-Medical): No  Physical Activity: Sufficiently Active (08/10/2024)   Exercise Vital Sign    Days of Exercise per Week: 5 days    Minutes  of Exercise per Session: 60 min  Stress: No Stress Concern Present (08/10/2024)   [employer] of Occupational Health - Occupational Stress Questionnaire    Feeling of Stress: Not at all  Social Connections: Moderately Integrated (08/10/2024)   Social Connection and Isolation Panel    Frequency of Communication with Friends and Family: More than three times a week    Frequency of Social Gatherings with Friends and Family: More than three times a week    Attends Religious Services: More than 4 times per year    Active Member of [employer] or Organizations: Yes    Attends Engineer, structural: More than 4 times per year    Marital Status: Divorced    Tobacco Counseling Counseling given: Not  Answered    Clinical Intake:  Pre-visit preparation completed: Yes  Pain : No/denies pain     BMI - recorded: 22.78 Nutritional Status: BMI of 19-24  Normal Nutritional Risks: None Diabetes: Yes CBG done?: No Did pt. bring in CBG monitor from home?: No  Lab Results  Component Value Date   HGBA1C 6.4 06/13/2024   HGBA1C 6.6 (H) 03/21/2024   HGBA1C 6.6 (H) 03/14/2024     How often do you need to have someone help you when you read instructions, pamphlets, or other written materials from your doctor or pharmacy?: 1 - Never  Interpreter Needed?: No  Information entered by :: Hermen Mario, RMA   Activities of Daily Living     08/06/2024   11:58 AM  In your present state of health, do you have any difficulty performing the following activities:  Hearing? 0  Vision? 0  Difficulty concentrating or making decisions? 0  Walking or climbing stairs? 0  Dressing or bathing? 0  Doing errands, shopping? 0  Preparing Food and eating ? N  Using the Toilet? N  In the past six months, have you accidently leaked urine? Y  Do you have problems with loss of bowel control? N  Managing your Medications? N  Managing your Finances? N  Housekeeping or managing your Housekeeping? N     Patient Care Team: Plotnikov, Karlynn GAILS, MD as PCP - General (Internal Medicine) Santo Stanly LABOR, MD as PCP - Cardiology (Cardiology) Rosalind Zachary NOVAK, MD (Inactive) as Consulting Physician (Urology) Armbruster, Elspeth SQUIBB, MD as Consulting Physician (Gastroenterology) Santo Stanly LABOR, MD as Consulting Physician (Cardiology) Cleotilde Sewer, OD as Consulting Physician (Optometry) Vertell Pont, RN as Oncology Nurse Navigator  I have updated your Care Teams any recent Medical Services you may have received from other providers in the past year.     Assessment:   This is a routine wellness examination for [place].  Hearing/Vision screen Hearing Screening - Comments:: Denies hearing difficulties   Vision Screening - Comments:: Wears eyeglasses for reading/Sally Miller   Goals Addressed   None    Depression Screen     08/10/2024    8:20 AM 06/28/2024    2:53 PM 04/09/2024   10:30 AM 09/21/2023    7:49 AM 08/09/2023    8:55 AM 06/06/2023    7:58 AM 03/07/2023    8:00 AM  PHQ 2/9 Scores  PHQ - 2 Score 0 0 0 0 0 0 0  PHQ- 9 Score 0    1      Fall Risk     08/06/2024   11:58 AM 06/28/2024    2:53 PM 09/21/2023    7:49 AM 08/09/2023    8:55 AM 08/05/2023    9:02 AM  Fall Risk   Falls in the past year? 0 0 0 0 0  Number falls in past yr:  0 0 0 0  Injury with Fall?  0 0 0 1  Risk for fall due to :  No Fall Risks No Fall Risks No Fall Risks   Follow up Falls evaluation completed;Falls prevention discussed Falls evaluation completed Falls evaluation completed Falls prevention discussed     MEDICARE RISK AT HOME:  Medicare Risk at Home Any stairs in or around the home?: (Patient-Rptd) Yes If so, are there any without handrails?: (Patient-Rptd) No Home free of loose throw rugs in walkways, pet beds, electrical cords, etc?: (Patient-Rptd) Yes Adequate lighting in your home to reduce risk of falls?: (Patient-Rptd) Yes  Life alert?: (Patient-Rptd) No Use of a cane,  walker or w/c?: (Patient-Rptd) No Grab bars in the bathroom?: (Patient-Rptd) No Shower chair or bench in shower?: (Patient-Rptd) No Elevated toilet seat or a handicapped toilet?: (Patient-Rptd) Yes  TIMED UP AND GO:  Was the test performed?  No  Cognitive Function: Declined/Normal: No cognitive concerns noted by patient or family. Patient alert, oriented, able to answer questions appropriately and recall recent events. No signs of memory loss or confusion.        08/09/2023    8:56 AM 08/04/2022   11:01 AM  6CIT Screen  What Year? 0 points 0 points  What month? 0 points 0 points  What time? 0 points 0 points  Count back from 20 0 points 0 points  Months in reverse 0 points 0 points  Repeat phrase 0 points 0 points  Total Score 0 points 0 points    Immunizations Immunization History  Administered Date(s) Administered   Fluad Quad(high Dose 65+) 09/01/2021   Fluad Trivalent(High Dose 65+) 09/21/2023   Moderna SARS-COV2 Booster Vaccination 10/07/2020   Moderna Sars-Covid-2 Vaccination 02/06/2020, 03/07/2020   Pneumococcal Conjugate-13 12/15/2020   Tdap 03/22/2022    Screening Tests Health Maintenance  Topic Date Due   FOOT EXAM  Never done   OPHTHALMOLOGY EXAM  Never done   Hepatitis C Screening  Never done   Influenza Vaccine  06/22/2024   Pneumococcal Vaccine: 50+ Years (2 of 2 - PPSV23, PCV20, or PCV21) 06/20/2025 (Originally 02/09/2021)   COVID-19 Vaccine (3 - Moderna risk series) 06/20/2025 (Originally 11/04/2020)   Zoster Vaccines- Shingrix (1 of 2) 06/20/2025 (Originally 11/03/1965)   HEMOGLOBIN A1C  12/14/2024   Diabetic kidney evaluation - eGFR measurement  06/13/2025   Diabetic kidney evaluation - Urine ACR  06/13/2025   Medicare Annual Wellness (AWV)  08/10/2025   Colonoscopy  03/12/2026   DTaP/Tdap/Td (2 - Td or Tdap) 03/22/2032   HPV VACCINES  Aged Out   Meningococcal B Vaccine  Aged Out    Health Maintenance Items Addressed: Diabetic Foot Exam  recommended, Labs Ordered: Hep C screening due, See Nurse Notes at the end of this note  Additional Screening:  Vision Screening: Recommended annual ophthalmology exams for early detection of glaucoma and other disorders of the eye. Is the patient up to date with their annual eye exam?  No  Who is the provider or what is the name of the office in which the patient attends annual eye exams? Dr. Ginnie Pinal  Dental Screening: Recommended annual dental exams for proper oral hygiene  Community Resource Referral / Chronic Care Management: CRR required this visit?  No   CCM required this visit?  No   Plan:    I have personally reviewed and noted the following in the patient's chart:   Medical and social history Use of alcohol, tobacco or illicit drugs  Current medications and supplements including opioid prescriptions. Patient is not currently taking opioid prescriptions. Functional ability and status Nutritional status Physical activity Advanced directives List of other physicians Hospitalizations, surgeries, and ER visits in previous 12 months Vitals Screenings to include cognitive, depression, and falls Referrals and appointments  In addition, I have reviewed and discussed with patient certain preventive protocols, quality metrics, and best practice recommendations. A written personalized care plan for preventive services as well as general preventive health recommendations were provided to patient.   Demaya Hardge L Arvie Bartholomew, CMA   08/10/2024   After Visit Summary: (MyChart) Due to this being a telephonic  visit, the after visit summary with patients personalized plan was offered to patient via MyChart   Notes: Patient is due for a Flu vaccine and would like to get that done during his next office visit.  Patient is due for a Hep C screening and also a foot exam and can get those done during his next office visit as well.  Patient has requested to have his lab work done before his  scheduled appointment on Oct 30th, 2025.  Please put orders in so patient can schedule to have lab work done before 09/20/24.  He had no other concerns to address today.  Medical screening examination/treatment/procedure(s) were performed by non-physician practitioner and as supervising physician I was immediately available for consultation/collaboration.  I agree with above. Karlynn Noel, MD

## 2024-08-10 NOTE — Patient Instructions (Addendum)
 Shawn Orozco,  Thank you for taking the time for your Medicare Wellness Visit. I appreciate your continued commitment to your health goals. Please review the care plan we discussed, and feel free to reach out if I can assist you further.  Medicare recommends these wellness visits once per year to help you and your care team stay ahead of potential health issues. These visits are designed to focus on prevention, allowing your provider to concentrate on managing your acute and chronic conditions during your regular appointments.  Please note that Annual Wellness Visits do not include a physical exam. Some assessments may be limited, especially if the visit was conducted virtually. If needed, we may recommend a separate in-person follow-up with your provider.  Ongoing Care Seeing your primary care provider every 3 to 6 months helps us  monitor your health and provide consistent, personalized care. Next office visit on 09/20/2024.  You are due for a Flu vaccine, a foot exam and a Hep C screening.  Remember to call the office to get scheduled for lab work before your next office visit.    Referrals If a referral was made during today's visit and you haven't received any updates within two weeks, please contact the referred provider directly to check on the status.  Recommended Screenings:  Health Maintenance  Topic Date Due   Complete foot exam   Never done   Eye exam for diabetics  Never done   Hepatitis C Screening  Never done   Flu Shot  06/22/2024   Medicare Annual Wellness Visit  08/08/2024   Pneumococcal Vaccine for age over 64 (2 of 2 - PPSV23, PCV20, or PCV21) 06/20/2025*   COVID-19 Vaccine (3 - Moderna risk series) 06/20/2025*   Zoster (Shingles) Vaccine (1 of 2) 06/20/2025*   Hemoglobin A1C  12/14/2024   Yearly kidney function blood test for diabetes  06/13/2025   Yearly kidney health urinalysis for diabetes  06/13/2025   Colon Cancer Screening  03/12/2026   DTaP/Tdap/Td vaccine (2 -  Td or Tdap) 03/22/2032   HPV Vaccine  Aged Out   Meningitis B Vaccine  Aged Out  *Topic was postponed. The date shown is not the original due date.       08/10/2024    8:17 AM  Advanced Directives  Does Patient Have a Medical Advance Directive? Yes  Type of Estate agent of [place];Living will  Copy of Healthcare Power of Attorney in Chart? No - copy requested   Advance Care Planning is important because it: Ensures you receive medical care that aligns with your values, goals, and preferences. Provides guidance to your family and loved ones, reducing the emotional burden of decision-making during critical moments.  Vision: Annual vision screenings are recommended for early detection of glaucoma, cataracts, and diabetic retinopathy. These exams can also reveal signs of chronic conditions such as diabetes and high blood pressure.  Dental: Annual dental screenings help detect early signs of oral cancer, gum disease, and other conditions linked to overall health, including heart disease and diabetes.  Please see the attached documents for additional preventive care recommendations.

## 2024-09-10 ENCOUNTER — Inpatient Hospital Stay: Attending: Adult Health | Admitting: *Deleted

## 2024-09-10 ENCOUNTER — Encounter: Payer: Self-pay | Admitting: *Deleted

## 2024-09-10 DIAGNOSIS — C61 Malignant neoplasm of prostate: Secondary | ICD-10-CM

## 2024-09-10 NOTE — Progress Notes (Signed)
 SCP reviewed and completed. Pt will have PSA lab work done on Oct.28th and visit Dr. Selma on Nov.4th. Pt will see PCP on Oct.30 and receive flu vaccine at this appt.  Pt explained he is having some issues with stress incontinence since radiation. He is wearing Depends currently. He does have some constipation some days. RN Navigator talked with patient about trying Miralax and continue the Colace he's already taking. Pt will continue exercise regimen.

## 2024-09-13 ENCOUNTER — Ambulatory Visit: Admitting: Internal Medicine

## 2024-09-13 ENCOUNTER — Other Ambulatory Visit

## 2024-09-13 DIAGNOSIS — K5900 Constipation, unspecified: Secondary | ICD-10-CM | POA: Diagnosis not present

## 2024-09-13 DIAGNOSIS — N2889 Other specified disorders of kidney and ureter: Secondary | ICD-10-CM

## 2024-09-13 DIAGNOSIS — E118 Type 2 diabetes mellitus with unspecified complications: Secondary | ICD-10-CM | POA: Diagnosis not present

## 2024-09-13 DIAGNOSIS — I2583 Coronary atherosclerosis due to lipid rich plaque: Secondary | ICD-10-CM

## 2024-09-13 LAB — CBC WITH DIFFERENTIAL/PLATELET
Basophils Absolute: 0 K/uL (ref 0.0–0.1)
Basophils Relative: 0.4 % (ref 0.0–3.0)
Eosinophils Absolute: 0.1 K/uL (ref 0.0–0.7)
Eosinophils Relative: 1.9 % (ref 0.0–5.0)
HCT: 41.1 % (ref 39.0–52.0)
Hemoglobin: 13.8 g/dL (ref 13.0–17.0)
Lymphocytes Relative: 18 % (ref 12.0–46.0)
Lymphs Abs: 1 K/uL (ref 0.7–4.0)
MCHC: 33.6 g/dL (ref 30.0–36.0)
MCV: 99.3 fl (ref 78.0–100.0)
Monocytes Absolute: 0.6 K/uL (ref 0.1–1.0)
Monocytes Relative: 10.2 % (ref 3.0–12.0)
Neutro Abs: 3.8 K/uL (ref 1.4–7.7)
Neutrophils Relative %: 69.5 % (ref 43.0–77.0)
Platelets: 158 K/uL (ref 150.0–400.0)
RBC: 4.14 Mil/uL — ABNORMAL LOW (ref 4.22–5.81)
RDW: 13 % (ref 11.5–15.5)
WBC: 5.4 K/uL (ref 4.0–10.5)

## 2024-09-13 LAB — COMPREHENSIVE METABOLIC PANEL WITH GFR
ALT: 12 U/L (ref 0–53)
AST: 19 U/L (ref 0–37)
Albumin: 4.2 g/dL (ref 3.5–5.2)
Alkaline Phosphatase: 55 U/L (ref 39–117)
BUN: 15 mg/dL (ref 6–23)
CO2: 28 meq/L (ref 19–32)
Calcium: 9.3 mg/dL (ref 8.4–10.5)
Chloride: 104 meq/L (ref 96–112)
Creatinine, Ser: 1.14 mg/dL (ref 0.40–1.50)
GFR: 61.45 mL/min (ref >60.00)
Glucose, Bld: 113 mg/dL — ABNORMAL HIGH (ref 70–99)
Potassium: 3.9 meq/L (ref 3.5–5.1)
Sodium: 139 meq/L (ref 135–145)
Total Bilirubin: 1.9 mg/dL — ABNORMAL HIGH (ref 0.2–1.2)
Total Protein: 6.7 g/dL (ref 6.0–8.3)

## 2024-09-13 LAB — URINALYSIS
Bilirubin Urine: NEGATIVE
Ketones, ur: NEGATIVE
Leukocytes,Ua: NEGATIVE
Nitrite: NEGATIVE
Specific Gravity, Urine: 1.015 (ref 1.000–1.030)
Urine Glucose: NEGATIVE
Urobilinogen, UA: 0.2 (ref 0.0–1.0)
pH: 6 (ref 5.0–8.0)

## 2024-09-13 LAB — TSH: TSH: 2.54 u[IU]/mL (ref 0.35–5.50)

## 2024-09-13 LAB — HEMOGLOBIN A1C: Hgb A1c MFr Bld: 6.1 % (ref 4.6–6.5)

## 2024-09-16 ENCOUNTER — Ambulatory Visit: Payer: Self-pay | Admitting: Internal Medicine

## 2024-09-20 ENCOUNTER — Ambulatory Visit: Admitting: Internal Medicine

## 2024-09-20 ENCOUNTER — Encounter: Payer: Self-pay | Admitting: Internal Medicine

## 2024-09-20 VITALS — BP 116/64 | HR 65 | Temp 98.1°F | Ht 72.0 in | Wt 169.8 lb

## 2024-09-20 DIAGNOSIS — N2889 Other specified disorders of kidney and ureter: Secondary | ICD-10-CM

## 2024-09-20 DIAGNOSIS — Z23 Encounter for immunization: Secondary | ICD-10-CM | POA: Diagnosis not present

## 2024-09-20 DIAGNOSIS — I2583 Coronary atherosclerosis due to lipid rich plaque: Secondary | ICD-10-CM | POA: Diagnosis not present

## 2024-09-20 DIAGNOSIS — C61 Malignant neoplasm of prostate: Secondary | ICD-10-CM | POA: Diagnosis not present

## 2024-09-20 DIAGNOSIS — E119 Type 2 diabetes mellitus without complications: Secondary | ICD-10-CM | POA: Diagnosis not present

## 2024-09-20 NOTE — Assessment & Plan Note (Signed)
 Status post recent radiation therapy - recovering

## 2024-09-20 NOTE — Assessment & Plan Note (Signed)
Better Hydrate well

## 2024-09-20 NOTE — Assessment & Plan Note (Signed)
  On ASA, Crestor  F/u w/Dr Santo

## 2024-09-20 NOTE — Patient Instructions (Signed)
 Try Kefir Florastor

## 2024-09-20 NOTE — Assessment & Plan Note (Signed)
 Better

## 2024-09-20 NOTE — Progress Notes (Signed)
 Subjective:  Patient ID: Shawn Orozco, male    DOB: 01/20/1946  Age: 78 y.o. MRN: [id number]  CC: Medical Management of Chronic Issues (3 Month follow up)   HPI Shawn Orozco presents for insomnia, abd bloating - resolved 1 month ago  S/p XRT for prostate cancer - loose stools   Outpatient Medications Prior to Visit  Medication Sig Dispense Refill   aspirin  EC 81 MG tablet Take 81 mg by mouth daily. Swallow whole.     cholecalciferol (VITAMIN D3) 25 MCG (1000 UNIT) tablet Take 1,000 Units by mouth daily.     FLONASE SENSIMIST 27.5 MCG/SPRAY nasal spray 2 sprays 2 (two) times daily.     fluorouracil (EFUDEX) 5 % cream Apply topically 2 (two) times daily as needed.     Hypertonic Nasal Wash (SINUS RINSE) PACK Place into the nose as needed.     loratadine  (CLARITIN ) 10 MG tablet Take 1 tablet (10 mg total) by mouth daily. 30 tablet 11   Niacinamide 500 MG TBCR Take 500 mg by mouth daily.     omeprazole  (PRILOSEC) 20 MG capsule Take 1 capsule (20 mg total) by mouth daily. 90 capsule 1   rosuvastatin  (CRESTOR ) 20 MG tablet Take 1 tablet (20 mg total) by mouth daily. KEEP OV. 90 tablet 3   No facility-administered medications prior to visit.    ROS: Review of Systems  Constitutional:  Negative for appetite change, fatigue and unexpected weight change.  HENT:  Negative for congestion, nosebleeds, sneezing, sore throat and trouble swallowing.   Eyes:  Negative for itching and visual disturbance.  Respiratory:  Negative for cough.   Cardiovascular:  Negative for chest pain, palpitations and leg swelling.  Gastrointestinal:  Negative for abdominal distention, blood in stool, diarrhea and nausea.  Genitourinary:  Positive for frequency. Negative for hematuria and urgency.  Musculoskeletal:  Negative for arthralgias, back pain, gait problem, joint swelling and neck pain.  Skin:  Negative for rash.  Neurological:  Negative for dizziness, tremors, speech difficulty and weakness.   Psychiatric/Behavioral:  Negative for agitation, dysphoric mood and sleep disturbance. The patient is not nervous/anxious.     Objective:  BP 116/64   Pulse 65   Temp 98.1 F (36.7 C)   Ht 6' (1.829 m)   Wt 169 lb 12.8 oz (77 kg)   SpO2 99%   BMI 23.03 kg/m   BP Readings from Last 3 Encounters:  09/20/24 116/64  06/28/24 110/60  06/20/24 118/60    Wt Readings from Last 3 Encounters:  09/20/24 169 lb 12.8 oz (77 kg)  08/10/24 168 lb (76.2 kg)  06/28/24 168 lb (76.2 kg)    Physical Exam Constitutional:      General: He is not in acute distress.    Appearance: He is well-developed.     Comments: NAD  Eyes:     Conjunctiva/sclera: Conjunctivae normal.     Pupils: Pupils are equal, round, and reactive to light.  Neck:     Thyroid : No thyromegaly.     Vascular: No JVD.  Cardiovascular:     Rate and Rhythm: Normal rate and regular rhythm.     Heart sounds: Normal heart sounds. No murmur heard.    No friction rub. No gallop.  Pulmonary:     Effort: Pulmonary effort is normal. No respiratory distress.     Breath sounds: Normal breath sounds. No wheezing or rales.  Chest:     Chest wall: No tenderness.  Abdominal:     General:  Bowel sounds are normal. There is no distension.     Palpations: Abdomen is soft. There is no mass.     Tenderness: There is no abdominal tenderness. There is no guarding or rebound.  Genitourinary:    Rectum: Guaiac result negative.  Musculoskeletal:        General: No tenderness. Normal range of motion.     Cervical back: Normal range of motion.  Lymphadenopathy:     Cervical: No cervical adenopathy.  Skin:    General: Skin is warm and dry.     Findings: No rash.  Neurological:     Mental Status: He is alert and oriented to person, place, and time.     Cranial Nerves: No cranial nerve deficit.     Motor: No abnormal muscle tone.     Coordination: Coordination normal.     Gait: Gait normal.     Deep Tendon Reflexes: Reflexes are normal  and symmetric.  Psychiatric:        Behavior: Behavior normal.        Thought Content: Thought content normal.        Judgment: Judgment normal.     Lab Results  Component Value Date   WBC 5.4 09/13/2024   HGB 13.8 09/13/2024   HCT 41.1 09/13/2024   PLT 158.0 09/13/2024   GLUCOSE 113 (H) 09/13/2024   CHOL 113 06/13/2024   TRIG 79.0 06/13/2024   HDL 43.20 06/13/2024   LDLCALC 54 06/13/2024   ALT 12 09/13/2024   AST 19 09/13/2024   NA 139 09/13/2024   K 3.9 09/13/2024   CL 104 09/13/2024   CREATININE 1.14 09/13/2024   BUN 15 09/13/2024   CO2 28 09/13/2024   TSH 2.54 09/13/2024   PSA 0.71 06/13/2024   HGBA1C 6.1 09/13/2024   MICROALBUR <0.7 06/13/2024    No results found.  Assessment & Plan:   Problem List Items Addressed This Visit     Chronic renal impairment, stage 3a   Better Hydrate well      Relevant Orders   Hemoglobin A1c   Coronary atherosclerosis    On ASA, Crestor  F/u w/Dr Santo      Diabetes type 2, controlled (HCC)   Better       Relevant Orders   Comprehensive metabolic panel with GFR   Hemoglobin A1c   Prostate cancer (HCC)   Status post recent radiation therapy - recovering      Other Visit Diagnoses       Immunization due    -  Primary   Relevant Orders   Flu vaccine HIGH DOSE PF(Fluzone Trivalent) (Completed)         No orders of the defined types were placed in this encounter.     Follow-up: Return in about 6 months (around 03/21/2025) for a follow-up visit.  Marolyn Noel, MD

## 2024-12-01 ENCOUNTER — Ambulatory Visit
Admission: EM | Admit: 2024-12-01 | Discharge: 2024-12-01 | Disposition: A | Discharge status: Home / Self Care | Attending: Physician Assistant | Admitting: Physician Assistant

## 2024-12-01 ENCOUNTER — Other Ambulatory Visit: Payer: Self-pay

## 2024-12-01 DIAGNOSIS — R0981 Nasal congestion: Secondary | ICD-10-CM

## 2024-12-01 DIAGNOSIS — R0989 Other specified symptoms and signs involving the circulatory and respiratory systems: Secondary | ICD-10-CM | POA: Diagnosis not present

## 2024-12-01 LAB — POCT INFLUENZA A/B
Influenza A, POC: NEGATIVE
Influenza B, POC: NEGATIVE

## 2024-12-01 NOTE — ED Provider Notes (Signed)
 [facility]    CSN: [id number] Arrival date & time: 12/01/24  0801      History   Chief Complaint Chief Complaint  Patient presents with   Nasal Congestion    HPI Enos Muhl is a 79 y.o. male.   HPI  Pt is here today with concerns of a sinus infection. He states he started to feel bad on Wed morning with sore throat with developed into nasal congestion and sinus pressure. He reports some postnasal drainage as well.  He denies known sick contacts but was at two basketball games on Tuesday and was around a lot of people Interventions: Sinus flushes, lozenges  He takes claritin  and flonase daily and reports a previous hx of recurrent sinus issues. He states he hasn't had this issue in almost 18 months since starting this regimen.   Past Medical History:  Diagnosis Date   Arthritis    Cataract    removed bilat 2014 with lens implants    Colon polyps    Deviated septum    GERD (gastroesophageal reflux disease)    Hyperlipidemia    preventative for FH    Prostate cancer (HCC) 2021   04-14-2020 prostate surgery     Patient Active Problem List   Diagnosis Date Noted   Abdominal pain 06/29/2024   Constipation 06/20/2024   Insomnia 06/20/2024   Upper respiratory infection 03/21/2024   Internal derangement of right knee involving anterior horn of medial meniscus 08/23/2023   Diabetes type 2, controlled (HCC) 06/07/2023   Chronic renal impairment, stage 3a 03/07/2023   Hyperglycemia 03/07/2023   Belching 07/07/2022   Seborrheic dermatitis 04/22/2022   Low back pain 03/29/2022   Vasovagal symptom 03/25/2022   Fractured nose 03/22/2022   Allergic rhinitis 03/03/2022   Plantar fasciitis of right foot 07/23/2021   RBBB 01/05/2021   Coronary atherosclerosis 12/26/2020   Prostate cancer (HCC) 12/15/2020   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 Authorizing Provider  aspirin  EC 81 MG tablet Take 81 mg by mouth daily. Swallow whole.    [provider]  cholecalciferol (VITAMIN D3) 25 MCG (1000 UNIT) tablet Take 1,000 Units by mouth daily. 06/22/22   [provider]  FLONASE SENSIMIST 27.5 MCG/SPRAY nasal spray 2 sprays 2 (two) times daily.    [provider]  fluorouracil (EFUDEX) 5 % cream Apply topically 2 (two) times daily as needed. 02/09/24   [provider]  Hypertonic Nasal Wash (SINUS RINSE) PACK Place into the nose as needed.    [provider]  loratadine  (CLARITIN ) 10 MG tablet Take 1 tablet (10 mg total) by mouth daily. 03/03/22   Plotnikov, Aleksei V, MD  Niacinamide 500 MG TBCR Take 500 mg by mouth daily. 12/31/20   [provider]  omeprazole  (PRILOSEC) 20 MG capsule Take  1 capsule (20 mg total) by mouth daily. 07/10/24   Zehr, Jessica D, PA-C  rosuvastatin  (CRESTOR ) 20 MG tablet Take 1 tablet (20 mg total) by mouth daily. KEEP OV. 04/12/24   Santo Stanly LABOR, MD    Family History Family History  Problem Relation Age of Onset   Alzheimer's disease Mother    Alzheimer's disease Brother    Heart disease Brother    Heart disease Brother    Colon cancer Neg Hx    Colon polyps Neg Hx    Esophageal cancer Neg Hx    Rectal cancer Neg Hx    Stomach cancer Neg Hx     Social History Social History[1]   Allergies   Prednisone   Review of Systems Review of Systems  Constitutional:  Negative  for chills and fever.  HENT:  Positive for congestion, postnasal drip, sinus pressure and sore throat. Negative for ear pain.   Respiratory:  Negative for cough, shortness of breath and wheezing.   Gastrointestinal:  Negative for diarrhea, nausea and vomiting.     Physical Exam Triage Vital Signs ED Triage Vitals  Encounter Vitals Group     BP 12/01/24 0817 (!) 150/69     Girls Systolic BP Percentile --      Girls Diastolic BP Percentile --      Boys Systolic BP Percentile --      Boys Diastolic BP Percentile --      Pulse Rate 12/01/24 0817 76     Resp 12/01/24 0817 18     Temp 12/01/24 0817 97.9 F (36.6 C)     Temp Source 12/01/24 0817 Oral     SpO2 12/01/24 0817 95 %     Weight --      Height --      Head Circumference --      Peak Flow --      Pain Score 12/01/24 0816 0     Pain Loc --      Pain Education --      Exclude from Growth Chart --    No data found.  Updated Vital Signs BP (!) 150/69 (BP Location: Right Arm)   Pulse 76   Temp 97.9 F (36.6 C) (Oral)   Resp 18   SpO2 95%   Visual Acuity Right Eye Distance:   Left Eye Distance:   Bilateral Distance:    Right Eye Near:   Left Eye Near:    Bilateral Near:     Physical Exam Vitals reviewed.  Constitutional:      General: He is awake.     Appearance: Normal appearance. He is well-developed and well-groomed.  HENT:     Head: Normocephalic and atraumatic.     Right Ear: Hearing, tympanic membrane and ear canal normal.     Left Ear: Hearing, tympanic membrane and ear canal normal.     Nose:     Right Sinus: No maxillary sinus tenderness or frontal sinus tenderness.     Left Sinus: No maxillary sinus tenderness or frontal sinus tenderness.     Mouth/Throat:     Lips: Pink.     Mouth: Mucous membranes are moist.     Pharynx: Oropharynx is clear. Uvula midline. No pharyngeal swelling, oropharyngeal exudate, posterior oropharyngeal erythema, uvula swelling or postnasal drip.     Tonsils: No  tonsillar exudate or tonsillar abscesses.  Eyes:     General: Lids are normal. Gaze aligned appropriately.     Extraocular Movements: Extraocular movements  intact.  Cardiovascular:     Rate and Rhythm: Normal rate and regular rhythm.     Heart sounds: Normal heart sounds.  Pulmonary:     Effort: Pulmonary effort is normal.     Breath sounds: Normal breath sounds. No decreased air movement. No decreased breath sounds, wheezing, rhonchi or rales.  Musculoskeletal:     Cervical back: Normal range of motion and neck supple.  Lymphadenopathy:     Head:     Right side of head: No submental, submandibular or preauricular adenopathy.     Left side of head: No submental, submandibular or preauricular adenopathy.     Cervical:     Right cervical: No superficial cervical adenopathy.    Left cervical: No superficial cervical adenopathy.     Upper Body:     Right upper body: No supraclavicular adenopathy.     Left upper body: No supraclavicular adenopathy.  Skin:    General: Skin is warm and dry.  Neurological:     General: No focal deficit present.     Mental Status: He is alert and oriented to person, place, and time.  Psychiatric:        Mood and Affect: Mood normal.        Behavior: Behavior normal. Behavior is cooperative.        Thought Content: Thought content normal.        Judgment: Judgment normal.      UC Treatments / Results  Labs (all labs ordered are listed, but only abnormal results are displayed) Labs Reviewed  POCT INFLUENZA A/B    EKG   Radiology No results found.  Procedures Procedures (including critical care time)  Medications Ordered in UC Medications - No data to display  Initial Impression / Assessment and Plan / UC Course  I have reviewed the triage vital signs and the nursing notes.  Pertinent labs & imaging results that were available during my care of the patient were reviewed by me and considered in my medical decision making (see chart for  details).      Final Clinical Impressions(s) / UC Diagnoses   Final diagnoses:  Sinus congestion  Symptoms of upper respiratory infection (URI)  Patient is here today with concerns of sinus congestion, postnasal drainage, sinus pressure and mild sore throat that has been ongoing since 11/28/2024.  Rapid testing is negative for the flu.  Physical exam and vitals are largely reassuring.  At this time recommend supportive care with OTC medications.  Follow-up as needed    Discharge Instructions      Based on your described symptoms and the duration of symptoms it is likely that you have a viral upper respiratory infection (often called a cold)  Symptoms can last for 3-10 days with lingering cough and intermittent symptoms lasting weeks after that.  The goal of treatment at this time is to reduce your symptoms and discomfort   I recommend using Robitussin and Mucinex (regular formulations, nothing with decongestants or DM)  You can also use Tylenol for body aches and fever reduction You can use a humidifier at night to help with preventing nasal dryness and irritation   If your symptoms are not improving or seem to be getting worse over the next 5 to 7 days you can always return here to urgent care or you can follow-up with your primary care provider for ongoing management Go to the ER if you begin to have more serious symptoms such as shortness of breath, trouble breathing,  loss of consciousness, swelling around the eyes, high fever, severe lasting headaches, vision changes or neck pain/stiffness.       ED Prescriptions   None    PDMP not reviewed this encounter.     [1]  Social History Tobacco Use   Smoking status: Never   Smokeless tobacco: Never  Vaping Use   Vaping status: Never Used  Substance Use Topics   Alcohol use: Never   Drug use: Never     [place] Mccuen, Rocky BRAVO, PA-C 12/02/24 1126  "

## 2024-12-01 NOTE — ED Triage Notes (Signed)
 Pt presents with a chief complaint of nasal congestion. This is day four of symptoms. Does endorse a little sore throat however this improves with throat lozenges. No fevers. Unsure of sick contacts. Went to a basketball game on Tuesday night, 1/6. No pain at this time.

## 2024-12-01 NOTE — Discharge Instructions (Addendum)
 Based on your described symptoms and the duration of symptoms it is likely that you have a viral upper respiratory infection (often called a cold)  Symptoms can last for 3-10 days with lingering cough and intermittent symptoms lasting weeks after that.  The goal of treatment at this time is to reduce your symptoms and discomfort   I recommend using Robitussin and Mucinex (regular formulations, nothing with decongestants or DM)  You can also use Tylenol  for body aches and fever reduction  You can use a humidifier at night to help with preventing nasal dryness and irritation   If your symptoms are not improving or seem to be getting worse over the next 5 to 7 days you can always return here to urgent care or you can follow-up with your primary care provider for ongoing management Go to the ER if you begin to have more serious symptoms such as shortness of breath, trouble breathing, loss of consciousness, swelling around the eyes, high fever, severe lasting headaches, vision changes or neck pain/stiffness.

## 2025-03-21 ENCOUNTER — Ambulatory Visit: Admitting: Internal Medicine

## 2025-08-15 ENCOUNTER — Ambulatory Visit
